# Patient Record
Sex: Female | Born: 1970 | Race: White | Hispanic: No | Marital: Married | Smoking: Never smoker
Health system: Southern US, Community
[De-identification: ages and names within clinical notes are randomized; demographics above are authoritative.]

## PROBLEM LIST (undated history)

## (undated) DIAGNOSIS — I10 Essential (primary) hypertension: Secondary | ICD-10-CM

## (undated) DIAGNOSIS — E079 Disorder of thyroid, unspecified: Secondary | ICD-10-CM

## (undated) HISTORY — PX: MENISCECTOMY: SHX123

## (undated) HISTORY — DX: Essential (primary) hypertension: I10

## (undated) HISTORY — PX: THYROIDECTOMY: SHX17

## (undated) HISTORY — PX: ABLATION: SHX5711

---

## 1999-03-14 ENCOUNTER — Other Ambulatory Visit: Admission: RE | Admit: 1999-03-14 | Discharge: 1999-03-14 | Payer: Self-pay | Admitting: Obstetrics and Gynecology

## 2000-04-02 ENCOUNTER — Other Ambulatory Visit: Admission: RE | Admit: 2000-04-02 | Discharge: 2000-04-02 | Payer: Self-pay | Admitting: Obstetrics and Gynecology

## 2001-05-19 ENCOUNTER — Other Ambulatory Visit: Admission: RE | Admit: 2001-05-19 | Discharge: 2001-05-19 | Payer: Self-pay | Admitting: Obstetrics and Gynecology

## 2001-06-25 ENCOUNTER — Encounter: Payer: Self-pay | Admitting: Obstetrics and Gynecology

## 2001-06-25 ENCOUNTER — Ambulatory Visit (HOSPITAL_COMMUNITY): Admission: RE | Admit: 2001-06-25 | Discharge: 2001-06-25 | Payer: Self-pay | Admitting: Obstetrics and Gynecology

## 2001-07-18 ENCOUNTER — Observation Stay (HOSPITAL_COMMUNITY): Admission: AD | Admit: 2001-07-18 | Discharge: 2001-07-19 | Payer: Self-pay | Admitting: Obstetrics and Gynecology

## 2001-08-16 ENCOUNTER — Encounter: Payer: Self-pay | Admitting: Obstetrics and Gynecology

## 2001-08-16 ENCOUNTER — Ambulatory Visit (HOSPITAL_COMMUNITY): Admission: RE | Admit: 2001-08-16 | Discharge: 2001-08-16 | Payer: Self-pay | Admitting: Obstetrics and Gynecology

## 2001-09-09 ENCOUNTER — Ambulatory Visit (HOSPITAL_COMMUNITY): Admission: RE | Admit: 2001-09-09 | Discharge: 2001-09-09 | Payer: Self-pay | Admitting: Obstetrics and Gynecology

## 2001-11-25 ENCOUNTER — Inpatient Hospital Stay (HOSPITAL_COMMUNITY): Admission: AD | Admit: 2001-11-25 | Discharge: 2001-11-28 | Payer: Self-pay | Admitting: Obstetrics and Gynecology

## 2003-05-23 ENCOUNTER — Other Ambulatory Visit: Admission: RE | Admit: 2003-05-23 | Discharge: 2003-05-23 | Payer: Self-pay

## 2004-04-23 ENCOUNTER — Other Ambulatory Visit: Admission: RE | Admit: 2004-04-23 | Discharge: 2004-04-23 | Payer: Self-pay | Admitting: Diagnostic Radiology

## 2004-05-17 ENCOUNTER — Observation Stay (HOSPITAL_COMMUNITY): Admission: RE | Admit: 2004-05-17 | Discharge: 2004-05-18 | Payer: Self-pay

## 2004-05-17 ENCOUNTER — Encounter (INDEPENDENT_AMBULATORY_CARE_PROVIDER_SITE_OTHER): Payer: Self-pay | Admitting: Specialist

## 2004-05-29 ENCOUNTER — Other Ambulatory Visit: Admission: RE | Admit: 2004-05-29 | Discharge: 2004-05-29 | Payer: Self-pay | Admitting: Obstetrics and Gynecology

## 2004-07-12 ENCOUNTER — Observation Stay (HOSPITAL_COMMUNITY): Admission: RE | Admit: 2004-07-12 | Discharge: 2004-07-13 | Payer: Self-pay

## 2004-07-12 ENCOUNTER — Encounter (INDEPENDENT_AMBULATORY_CARE_PROVIDER_SITE_OTHER): Payer: Self-pay | Admitting: Specialist

## 2004-08-13 ENCOUNTER — Encounter (HOSPITAL_COMMUNITY): Admission: RE | Admit: 2004-08-13 | Discharge: 2004-11-11 | Payer: Self-pay | Admitting: Endocrinology

## 2005-04-07 ENCOUNTER — Encounter (HOSPITAL_COMMUNITY): Admission: RE | Admit: 2005-04-07 | Discharge: 2005-07-06 | Payer: Self-pay | Admitting: Endocrinology

## 2005-06-03 ENCOUNTER — Other Ambulatory Visit: Admission: RE | Admit: 2005-06-03 | Discharge: 2005-06-03 | Payer: Self-pay | Admitting: Obstetrics and Gynecology

## 2005-12-29 ENCOUNTER — Inpatient Hospital Stay (HOSPITAL_COMMUNITY): Admission: AD | Admit: 2005-12-29 | Discharge: 2005-12-29 | Payer: Self-pay | Admitting: Obstetrics and Gynecology

## 2006-03-11 ENCOUNTER — Inpatient Hospital Stay (HOSPITAL_COMMUNITY): Admission: AD | Admit: 2006-03-11 | Discharge: 2006-03-15 | Payer: Self-pay | Admitting: Obstetrics and Gynecology

## 2006-03-12 ENCOUNTER — Encounter (INDEPENDENT_AMBULATORY_CARE_PROVIDER_SITE_OTHER): Payer: Self-pay | Admitting: *Deleted

## 2007-05-10 ENCOUNTER — Encounter (HOSPITAL_COMMUNITY): Admission: RE | Admit: 2007-05-10 | Discharge: 2007-05-14 | Payer: Self-pay | Admitting: Internal Medicine

## 2010-12-29 ENCOUNTER — Encounter: Payer: Self-pay | Admitting: Endocrinology

## 2011-04-25 NOTE — Op Note (Signed)
NAME:  Paula Ortega, Paula Ortega NO.:  [id number]   MEDICAL RECORD NO.:  [id number]                   PATIENT TYPE:  OBV   LOCATION:  1610                                 FACILITY:  [facility]   PHYSICIAN:  Lorre Munroe., M.D.            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   OPERATION:  Thyroid isthmectomy.   SURGEON:  Lebron Conners, M.D.   ASSISTANT:  Abigail Miyamoto, M.D.   ANESTHESIA:  General.   DESCRIPTION OF PROCEDURE:  After the patient was monitored and anesthetized  and had routine preparation and draping of the anterior and lateral neck, I  made a collar incision about 5 cm in length across the midline about 2 cm  above the sternal notch. I dissected down through the fat and platysma and  then raised skin and platysmal flaps cephalad to the thyroid cartilage and  caudad to the sternal notch.  With those flaps retracted up and down, I  incised the midline of the neck and came directly down on the thyroid  isthmus and identified the abnormality.  It lay to the left of the midline  but was quite definitely within the isthmus providing an opportunity to get  a good amount of tissue on either side of it without doing a lobectomy.  Since it was toward the left, I first dissected the isthmus free of the  trachea to the right side and clamped across it with Kelly clamps and then  freed the isthmus up off the trachea.  I suture ligated the lobe cut  surface.  I then dissected up the left side and similarly clamped and cut  the isthmus out and sent it for frozen section.  I then suture ligated the  cut surface on the left side using 4-0 Vicryl.  There were no other nodules  palpable.  I could not  feel any cervical lymphadenopathy.  Dr. 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  I therefore decided not to  proceed with bilateral lobectomy.  After assuring good hemostasis and  irrigating the wound and obtaining correct sponge, needle and instrument  counts, I closed the midline with running 3-0 Vicryl and platysma with  interrupted 3-0 Vicryl and the skin with a running intracuticular 4-0 Vicryl  reinforced by Steri-Strips. The patient was stable upon transfer to PACU.  Lorre Munroe., M.D.   WB/MEDQ  D:  05/17/2004  T:  05/17/2004  Job:  [id number]   cc:   Joycelyn Rua, M.D.  [address] [place]  [place] [address]  Fax: [phone]

## 2011-04-25 NOTE — Discharge Summary (Signed)
[facility]  Patient:    Paula Ortega, Paula Ortega Visit Number: [id number] MRN: [id number]          Service Type: OBS Location: [id number] Attending Physician:  Michaele Offer Dictated by:   Alvino Chapel, M.D. Admit Date:  11/25/2001 Discharge Date: 11/28/2001                             Discharge Summary  DISCHARGE DIAGNOSES:          1. Term pregnancy at 40 weeks, delivered.                               2. Meconium-stained amniotic fluid.                               3. Normal spontaneous vaginal delivery.  DISCHARGE MEDICATIONS:        1. Motrin 600 mg p.o. every six hours.                               2. Percocet one to two tablets p.o. every four                                  hours.  DISCHARGE FOLLOWUP:           The patient is to follow up in six weeks for a routine postpartum exam.  HOSPITAL COURSE:              The patient is a 40 year old, G1, P0, who is admitted at 40 weeks for induction given a favorable cervix. The patient had no complications this pregnancy.  PRENATAL LABORATORY DATA:     Blood type is AB negative, antibody negative, RPR nonreactive, rubella immune, hepatitis B surface antigen negative, GC negative, Chlamydia negative, one hour glucola was elevated at 160 and three hour glucola was normal. Group B strep was negative.  PAST MEDICAL HISTORY:         None.  PAST SURGICAL HISTORY:        Left knee surgery.  PAST OBSTETRIC HISTORY:       None.  ALLERGIES:                    No known drug allergies.  FAMILY HISTORY:               The patient is adopted so her family history is unknown.  PHYSICAL EXAMINATION:         The patient is afebrile with stable vital signs. Fetal heart rate is reactive. She is gravid and nontender. EFW is 8 pounds. On exam the patient was 3 cm, 50% effaced, and -1 station. She has assisted rupture of membranes with light meconium noted. She was begun on Pitocin  and continued to progress.  After laboring all day the patient had reached complete dilation and pushed well. She had a normal spontaneous vaginal delivery of a 9-pound 5-ounce infant boy over a second degree laceration. Apgars were 7 and 9. There was a loose nuchal cord reduced. The anterior shoulder was slightly slow to deliver but there is no true dystocia. Light meconium fluid was bulb suctioned. Placenta delivered spontaneous  and intact. Second degree laceration and bilateral labial lacerations were noted. The second degree laceration was repaired with 3-0 Vicryl as well as the right labial laceration. The patient was then admitted for routine postpartum care and did very well. On postpartum day #1 she felt well and was felt stable for discharge. She requested this and was to be discharged the evening of postpartum day #1. She will follow up as previously stated.  Dictated by:   Alvino Chapel, M.D. Attending Physician:  Michaele Offer DD:  11/27/01 TD:  11/28/01 Job: [id number]

## 2011-04-25 NOTE — Discharge Summary (Signed)
NAMEMarland Ortega  [name], [name]                 ACCOUNT NO.:  [id number]   MEDICAL RECORD NO.:  [id number]          PATIENT TYPE:  INP   LOCATION:  9102                          FACILITY:  [facility]   PHYSICIAN:  Zenaida Niece, M.D.DATE OF BIRTH:  1971-03-12   DATE OF ADMISSION:  03/11/2006  DATE OF DISCHARGE:  03/15/2006                                 DISCHARGE SUMMARY   ADMISSION DIAGNOSIS:  Intrauterine pregnancy at 38 weeks.   DISCHARGE DIAGNOSES:  1.  Intrauterine pregnancy at 38 weeks.  2.  Arrest of dilation.  3.  Fetal macrosomia.  4.  Desires Surgical Sterility   PROCEDURES:  On March 12, 2006, she had a primary low transverse cesarean  section and tubal ligation.   HISTORY AND PHYSICAL:  This is a 40 year old white female gravida 2, para 1-  0-0-1 with an EGA of 38+ weeks who presents for elective induction.  She was  initially scheduled for a primary cesarean section on April05, 2007  due to  being breech.  However on her preop evaluation the fetus is in vertex  presentation and an ultrasound on April02, 2007 confirms vertex presentation  with an estimated fetal weight of approximately 4000 g and AFI of 20.  Prenatal care complicated by a history of hypothyroidism secondary to a  thyroidectomy for thyroid cancer, sinusitis at 16 weeks treated with  Augmentin, and her course was otherwise uncomplicated.   PAST OB HISTORY:  In 2002 vaginal delivery at 40 weeks, 9 pounds 5 ounces,  complicated by meconium.   PAST MEDICAL HISTORY:  Thyroid cancer.   PAST SURGICAL HISTORY:  Thyroidectomy and left knee surgery.   MEDICATIONS:  Synthroid 150 mcg daily.   PHYSICAL EXAM:  She is afebrile with stable vital signs.  Fetal heart  tracing is reactive.  Abdomen is gravid and nontender with an estimated  fetal weight of approximately 9 pounds.  Cervix on my first exam in the  hospital is 2-3, 30, -3, vertex presentation, adequate pelvis and amniotomy  revealed light meconium-stained  amniotic fluid.   HOSPITAL COURSE:  The patient was admitted and started on Pitocin.  On my  first exam that evening I was able to perform amniotomy also for  augmentation.  She progressed into labor and received an epidural.  However,  she progressed to 6 cm and made no progress past that.  Thus on April05,  2007 she had a primary low transverse cesarean section and tubal ligation  under epidural anesthesia.  Estimated blood loss was 1000 mL.  The patient  had normal anatomy and delivered a viable female infant with Apgars of 9 and 9  that weighed 9 pounds 12 ounces.  Postoperatively she had no significant  complications.  Predelivery hemoglobin 11.6, postdelivery 8.6.  On the  morning of postop day #3 she was felt to be stable enough for discharge  home.  Her incision was healing well and prior to discharge her staples were  to be removed and Steri-Strips applied.   DISCHARGE INSTRUCTIONS:  Regular diet, pelvic rest and no strenuous  activity.  Followup is  in approximately 2 weeks for an incision check.  Medications are Percocet #40 one to two p.o. q.4-6 h p.r.n. pain and over-  the-counter ibuprofen as needed.  She is also given our discharge pamphlet.      Zenaida Niece, M.D.  Electronically Signed     TDM/MEDQ  D:  03/15/2006  T:  03/16/2006  Job:  [id number]

## 2011-04-25 NOTE — Discharge Summary (Signed)
[facility]  Patient:    Paula Ortega, Paula Ortega Visit Number: [id number] MRN: [id number]          Service Type: OBS Location: [phone] Attending Physician:  Michaele Offer Dictated by:   Alvino Chapel, M.D. Admit Date:  11/25/2001 Discharge Date: 11/28/2001                             Discharge Summary  ADDENDUM  HISTORY:                      Briefly, the patient is a 40 year old, G1, P0, who had a normal spontaneous vaginal delivery on November 26, 2001. She had been a candidate for early discharge, however, decided to stay an extra day to work on breast-feeding and therefore was in house until November 28, 2001.  Upon discharge she was afebrile with stable vital signs and breast-feeding had begun to improve somewhat. She was discharged to home with instructions to follow up with the lactation consultant as needed. Dictated by:   Alvino Chapel, M.D. Attending Physician:  Michaele Offer DD:  11/28/01 TD:  11/28/01 Job: [id number]

## 2011-04-25 NOTE — Op Note (Signed)
NAME:  Paula Ortega, Paula Ortega NO.:  [id number]   MEDICAL RECORD NO.:  [id number]          PATIENT TYPE:  INP   LOCATION:  9102                          FACILITY:  [facility]   PHYSICIAN:  Leighton Roach Meisinger, M.D.DATE OF BIRTH:  1970/12/11   DATE OF PROCEDURE:  03/12/2006  DATE OF DISCHARGE:                                 OPERATIVE REPORT   PREOPERATIVE DIAGNOSIS:  Intrauterine pregnancy at 38+ weeks, arrest of  dilation and desires surgical sterility.   POSTOPERATIVE DIAGNOSIS:  Intrauterine pregnancy at 38+ weeks, arrest of  dilation and desires surgical sterility, and fetal macrosomia.   PROCEDURES:  Primary low transverse cesarean section and bilateral partial  salpingectomy.   SURGEON:  Zenaida Niece, M.D.   ANESTHESIA:  Epidural.   ESTIMATED BLOOD LOSS:  1000 mL.   SPECIMENS:  Portions of bilateral fallopian tubes.   FINDINGS:  The patient had normal anatomy, delivered a viable female infant  with Apgars of 9 and 9, weight 9 pounds 12 ounces.   PROCEDURE IN DETAIL:  The patient was taken to the operating room and placed  in the dorsosupine position.  Her previously placed epidural was dosed  appropriately.  Abdomen was then prepped and draped in the usual sterile  fashion.  The level of her anesthesia was found to be adequate and abdomen  was entered via standard Pfannenstiel's incision.  The self-retaining  retractor was then placed and a 4 cm transverse incision was made in the  lower uterine segment pushing the bladder inferior.  Once the uterine cavity  was entered the incision was extended bilaterally digitally.  The fetal  vertex was grasped and delivered through the incision atraumatically.  Mouth  and nares were suctioned.  A loose nuchal cord times one was reduced.  The  remainder of the infant then delivered atraumatically.  Cord was doubly  clamped and cut and the infant handed to the awaiting pediatric team.  Cord  blood was obtained and the  placenta was delivered spontaneously and sent for  cord blood collection.  Uterus was wiped dry with a clean lap pad and all  clots and debris removed.  Uterus was exteriorized and the incision was  inspected and found to be free of extensions.  The uterine incision was  closed in one layer being running locking layer with #1 chromic.  Bleeding  from the right angle was controlled with #1 chromic and 3-0 Vicryl.   Attention was turned to the tubal ligation.  Both fallopian tubes were  identified and traced to their fimbriated ends.  A knuckle of the middle  portion of each tube was grasped with a Babcock clamp.  A window was made in  an avascular portion of the mesosalpinx with electrocautery.  Zero plain gut  suture was passed through and tied proximally and then wrapped around  distally to form a knuckle of tube.  This knuckle of tube was then removed  sharply.  On both sides both tubal ostia were identified and the stumps were  hemostatic.  The uterus was then returned the abdomen.  3-0 Vicryl was used  for hemostasis on the left side and electrocautery was used on the  peritoneal edges.  The subfascial space was then irrigated and made  hemostatic with electrocautery.  Fascia was closed in a running fashion  starting at both ends and meeting in the middle with 0 Vicryl.  Subcutaneous  tissue was  then irrigated and made hemostatic with electrocautery.  The skin was closed  with staples followed by a sterile dressing.  The patient tolerated the  procedure well.  She was taken to recovery in stable condition after  tolerating the procedure well.  Counts were correct x2.  She received Ancef  1 gram after cord clamp.      Zenaida Niece, M.D.  Electronically Signed     TDM/MEDQ  D:  03/12/2006  T:  03/13/2006  Job:  [id number]

## 2011-04-25 NOTE — Op Note (Signed)
NAME:  Paula Ortega, PITA NO.:  [id number]   MEDICAL RECORD NO.:  [id number]                   PATIENT TYPE:  OBV   LOCATION:  0444                                 [facility]ACILITY:  [facility]   PHYSICIAN:  Lorre Munroe., M.D.            DATE OF BIRTH:  November 21, 1971   DATE OF PROCEDURE:  07/12/2004  DATE OF DISCHARGE:                                 OPERATIVE REPORT   PREOPERATIVE DIAGNOSES:  Papillary carcinoma of the thyroid gland.   POSTOPERATIVE DIAGNOSES:  Papillary carcinoma of the thyroid gland.   OPERATION:  Completion thyroidectomy (bilateral thyroid lobectomy).   SURGEON:  Lebron Conners, M.D.   ASSISTANT:  Ollen Gross. Carolynne Edouard, M.D.   ANESTHESIA:  General.   CLINICAL COURSE:  The patient is a 40 year old white female who has had a  thyroid isthmectomy because of a nonfunctional nodule which on histologic  examination was found to be a well differentiated carcinoma. After thorough  consideration and discussion, the patient elected to have completion  thyroidectomy particularly because of her plans for further pregnancies and  strong desire not to have a situation of recurrent thyroid cancer with  pregnancy occurring. She accept the risk of hypoparathyroidism and recurrent  laryngeal nerve injury.   DESCRIPTION OF PROCEDURE:  After the patient was monitored and anesthetized  and had routine preparation and draping of the neck, I reopened the previous  low neck transverse incision and extended it just slightly to the right  side.  I dissected down through the scar tissue and platysma muscle and  identified the sternocleidomastoid bilaterally.  I raised skin, subcutaneous  and muscular flaps under the platysma cephalad to the thyroid cartilage and  caudad to the sternal notch.  I put in a [place] self retaining retractor  and then incised the scar tissue in the midline right over the thyroid and  dissected laterally under the strap muscles. I worked first  on the left side  and dissected laterally and found the middle thyroid vein and clipped and  divided it. I then dissected up the edge of the gland and divided a little  bit of the anterior tissue and identified the superior pole vessels. The  left lobe was small. I clipped and divided the superior pole vessels and  then rolled the gland further forward and I identified what seemed likely to  be a parathyroid gland which corresponded to the superior gland and  preserved that. I then dissected the lower pole vessels and clipped and  divided the branches as they entered the thyroid gland.  I then divided the  anterior scar tissue and rolled the lobe forward onto the trachea,  dissecting very close to the gland.  I noted the recurrent nerve and made  sure I did not injure it.  I then completely removed the left lobe of the  thyroid gland and packed the area with dry gauze.  I  then followed a similar  procedure on the right side. The right lobe was considerably larger than the  left with more well developed vessels. I ligated and clipped the superior  pole vessels and performed a similar dissection. I did not see the recurrent  laryngeal nerve on the left side but kept my dissection extremely close to  the thyroid gland as I dissected it up out of the tracheoesophageal groove  and off the surface of the trachea. I completely excised the lobe and packed  it. I then obtained hemostasis on both sides with a combination of clips and  cautery and put small  pieces of Surgicel in the dissected area bilaterally.  Sponge, needle and  instrument counts were correct.  I closed the midline with running 3-0  Vicryl and closed the platysma and scar tissue with running 3-0 Vicryl and  closed the skin with running intracuticular 4-0 Vicryl and Steri-Strips.  She tolerated the operation well.                                               Lorre Munroe., M.D.    Jodi Marble  D:  07/12/2004  T:  07/13/2004   Job:  [id number]   cc:   Joycelyn Rua, M.D.  [address] [place]  [place] [address]  Fax: [phone]

## 2017-05-04 ENCOUNTER — Ambulatory Visit (INDEPENDENT_AMBULATORY_CARE_PROVIDER_SITE_OTHER): Payer: BC Managed Care – PPO

## 2017-05-04 ENCOUNTER — Encounter (HOSPITAL_COMMUNITY): Payer: Self-pay | Admitting: *Deleted

## 2017-05-04 ENCOUNTER — Ambulatory Visit (HOSPITAL_COMMUNITY)
Admission: EM | Admit: 2017-05-04 | Discharge: 2017-05-04 | Disposition: A | Discharge status: Home / Self Care | Payer: BC Managed Care – PPO | Attending: Family Medicine | Admitting: Family Medicine

## 2017-05-04 DIAGNOSIS — S29012A Strain of muscle and tendon of back wall of thorax, initial encounter: Secondary | ICD-10-CM

## 2017-05-04 DIAGNOSIS — M62830 Muscle spasm of back: Secondary | ICD-10-CM | POA: Diagnosis not present

## 2017-05-04 HISTORY — DX: Disorder of thyroid, unspecified: E07.9

## 2017-05-04 MED ORDER — CYCLOBENZAPRINE HCL 10 MG PO TABS: ORAL_TABLET | ORAL | 0 refills | Status: DC

## 2017-05-04 MED ORDER — KETOROLAC TROMETHAMINE 60 MG/2ML IM SOLN
INTRAMUSCULAR | Status: AC
  Filled 2017-05-04: qty 2

## 2017-05-04 MED ORDER — KETOROLAC TROMETHAMINE 60 MG/2ML IM SOLN
60.0000 mg | Freq: Once | INTRAMUSCULAR | Status: AC
  Administered 2017-05-04: 60 mg via INTRAMUSCULAR

## 2017-05-04 NOTE — ED Triage Notes (Signed)
Started with pain across mid-back 2 days ago - R<L; C/O SOB due to significant pain if she takes any deep breaths.  Denies any injuries.  Back non-tender to palpation.  Also has had some intermittent right supraclavicular pain with breathing, that was non-tender to palp PTA (has since resolved).  Turning torso aggravated pain.

## 2017-05-04 NOTE — Discharge Instructions (Signed)
You were given a shot of Toradol today to help with pain. Start Flexeril 10mg  1/2 to 1 tablet up to 3 times a day as needed for muscle spasms. May apply warm compresses to area for comfort. Recommend follow-up with your primary care provider in 3 to 4 days if not resolving or go to ER if pain worsens.

## 2017-05-04 NOTE — ED Provider Notes (Signed)
CSN: [id number]     Arrival date & time 05/04/17  1557 History   First MD Initiated Contact with Patient 05/04/17 1709     Chief Complaint  Patient presents with  . Back Pain  . Shortness of Breath   (Consider location/radiation/quality/duration/timing/severity/associated sxs/prior Treatment) 46 year old female presents with right sided back pain that is worse with deep breaths. Started 3 days ago in mid thoracic region bilaterally- no distinct injury but occurred after sitting and watching son's ball game. Now having more pain in the past few hours- mostly on right side. Pain is worse with taking deep breaths and twisting torso. No pain to palpation. Denies any fever, sweating, URI symptoms, cough, chest pain or GI symptoms. Has never had similar symptoms before. Has tried ice, heat and Mobic with no relief. No other chronic health issues except thyroid disease. Currently on Synthroid.    The history is provided by the patient.    Past Medical History:  Diagnosis Date  . Thyroid disease    Past Surgical History:  Procedure Laterality Date  . ABLATION    . CESAREAN SECTION    . MENISCECTOMY    . THYROIDECTOMY     No family history on file. Social History  Substance Use Topics  . Smoking status: Never Smoker  . Smokeless tobacco: Not on file  . Alcohol use Yes     Comment: occasionally   OB History    No data available     Review of Systems  Constitutional: Negative for appetite change, chills, diaphoresis, fatigue and fever.  HENT: Negative for congestion, ear pain, postnasal drip, rhinorrhea, sinus pain, sinus pressure, sore throat and trouble swallowing.   Respiratory: Positive for shortness of breath. Negative for cough, chest tightness and wheezing.   Cardiovascular: Negative for chest pain and palpitations.  Gastrointestinal: Negative for abdominal pain, nausea and vomiting.  Genitourinary: Negative for decreased urine volume, difficulty urinating and dysuria.    Musculoskeletal: Positive for back pain and myalgias (muscle spasms). Negative for arthralgias, neck pain and neck stiffness.  Skin: Negative for rash and wound.  Neurological: Negative for dizziness, syncope, weakness, light-headedness and headaches.  Hematological: Negative for adenopathy.    Allergies  Patient has no known allergies.  Home Medications   Prior to Admission medications   Medication Sig Start Date End Date Taking? Authorizing Provider  levothyroxine (SYNTHROID, LEVOTHROID) 150 MCG tablet Take 150 mcg by mouth daily before breakfast. Takes 6 days/wk; on 7th day only takes 1.5 pills   Yes [provider]  MELOXICAM PO Take by mouth.   Yes [provider]  cyclobenzaprine (FLEXERIL) 10 MG tablet Take 1/2 to 1 tablet by mouth up to 3 times a day as needed for muscle spasms 05/04/17   Sudie GrumblingAmyot, Charmion Hapke Berry, NP   Meds Ordered and Administered this Visit   Medications  ketorolac (TORADOL) injection 60 mg (60 mg Intramuscular Given 05/04/17 1815)    BP 132/77 (BP Location: Right Arm)   Pulse 84   Temp 99.3 F (37.4 C) (Oral)   Resp (!) 24   SpO2 100%  No data found.   Physical Exam  Constitutional: She is oriented to person, place, and time. She appears well-developed and well-nourished. She appears distressed.  Appears uncomfortable sitting on exam table due to pain.   HENT:  Head: Normocephalic and atraumatic.  Nose: Nose normal.  Mouth/Throat: Oropharynx is clear and moist.  Eyes: Conjunctivae and EOM are normal.  Neck: Normal range of motion  and full passive range of motion without pain. Neck supple. No spinous process tenderness and no muscular tenderness present. No neck rigidity. Normal range of motion present.  Cardiovascular: Normal rate, regular rhythm and normal heart sounds.   No murmur heard. Pulmonary/Chest: Effort normal and breath sounds normal. No accessory muscle usage. Tachypnea noted. No respiratory distress. She has no decreased  breath sounds. She has no wheezes. She has no rhonchi. She has no rales.  Musculoskeletal: She exhibits no tenderness.       Thoracic back: She exhibits decreased range of motion, pain and spasm. She exhibits no tenderness, no swelling, no edema and no deformity.       Back:  No tenderness to palpation but decreased range of motion- especially with rotation. Raising arms increased pain. Muscle spasms present on right mid-thoracic area. No numbness or neuro deficits noted.   Lymphadenopathy:    She has no cervical adenopathy.  Neurological: She is alert and oriented to person, place, and time. She has normal strength. No sensory deficit.  Skin: Skin is warm and dry. Capillary refill takes less than 2 seconds. No rash noted.  Psychiatric: She has a normal mood and affect. Her behavior is normal. Judgment and thought content normal.    Urgent Care Course     Procedures (including critical care time)  Labs Review Labs Reviewed - No data to display  Imaging Review Dg Chest 2 View  Result Date: 05/04/2017 CLINICAL DATA:  Pain in right mid back. EXAM: CHEST  2 VIEW COMPARISON:  None. FINDINGS: The heart size and mediastinal contours are within normal limits. Both lungs are clear. The visualized skeletal structures are unremarkable. IMPRESSION: No active cardiopulmonary disease. Electronically Signed   By: Gerome Sam III M.D   On: 05/04/2017 17:44     Visual Acuity Review  Right Eye Distance:   Left Eye Distance:   Bilateral Distance:    Right Eye Near:   Left Eye Near:    Bilateral Near:         MDM   1. Upper back strain, initial encounter   2. Spasm of thoracic back muscle    Reviewed negative chest x-ray results with patient. Discussed that history and clinical findings suggest muscle strain and spasm. Gave Toradol 60mg  IM now to help with pain. May continue Mobic 15mg  daily as needed. Start Flexeril 10mg  1/2 to 1 tablet up to 3 times a day as needed for muscle spasms.  May apply warm compresses to area for comfort. Recommend follow-up with her primary care provider in 3 to 4 days if not improving or go to ER if symptoms worsen.     Sudie Grumbling, NP 05/04/17 [id number]

## 2017-10-10 ENCOUNTER — Encounter (HOSPITAL_COMMUNITY): Payer: Self-pay | Admitting: Emergency Medicine

## 2017-10-10 ENCOUNTER — Ambulatory Visit (HOSPITAL_COMMUNITY)
Admission: EM | Admit: 2017-10-10 | Discharge: 2017-10-10 | Disposition: A | Discharge status: Home / Self Care | Payer: BC Managed Care – PPO | Attending: Family Medicine | Admitting: Family Medicine

## 2017-10-10 DIAGNOSIS — R21 Rash and other nonspecific skin eruption: Secondary | ICD-10-CM

## 2017-10-10 NOTE — ED Triage Notes (Signed)
Pt reports rash, and poss shingles, on lower right side back  Reports it started out w/a "phantom itch" 2 weeks but the rash itself appeared about 1-1.5 weeks ago associated w/HA  Denies fevers, chills  Pt reports she was applying husbands eczema cream w/no relief.   A&O x4... NAD... Ambulatory

## 2017-10-10 NOTE — ED Provider Notes (Signed)
[facility]    CSN: [id number] Arrival date & time: 10/10/17  1200     History   Chief Complaint Chief Complaint  Patient presents with  . Rash    HPI Paula Ortega is a 46 y.o. female.   Paula Ortega presents with complaints of rash to her right mid back. She states she felt a "phantom itch" to the area for approximately 1 week before approximately 5 bumps developed 1 week ago. More have not developed since then. It is mildly painful, but primarily itchy. Rates pain 1/10. Very itchy. Hydrocortisone cream helps with itching. No drainage that she has notice. The bumps were red and now have darkened in color. No known allergen exposures or new products/pets. Has had chickenpox as a child. Rash is not worsening. She has had mild headache. Better today. Denies uri symptoms.    ROS per HPI.       Past Medical History:  Diagnosis Date  . Thyroid disease     There are no active problems to display for this patient.   Past Surgical History:  Procedure Laterality Date  . ABLATION    . CESAREAN SECTION    . MENISCECTOMY    . THYROIDECTOMY      OB History    No data available       Home Medications    Prior to Admission medications   Medication Sig Start Date End Date Taking? Authorizing Provider  levothyroxine (SYNTHROID, LEVOTHROID) 150 MCG tablet Take 150 mcg by mouth daily before breakfast. Takes 6 days/wk; on 7th day only takes 1.5 pills   Yes [provider]  cyclobenzaprine (FLEXERIL) 10 MG tablet Take 1/2 to 1 tablet by mouth up to 3 times a day as needed for muscle spasms 05/04/17   Sudie Grumbling, NP  MELOXICAM PO Take by mouth.    [provider]    Family History History reviewed. No pertinent family history.  Social History Social History  Substance Use Topics  . Smoking status: Never Smoker  . Smokeless tobacco: Never Used  . Alcohol use Yes     Comment: occasionally     Allergies   Patient has no known  allergies.   Review of Systems Review of Systems   Physical Exam Triage Vital Signs ED Triage Vitals  Enc Vitals Group     BP 10/10/17 1214 (!) 136/102     Pulse Rate 10/10/17 1214 84     Resp 10/10/17 1214 20     Temp 10/10/17 1214 98.5 F (36.9 C)     Temp Source 10/10/17 1214 Oral     SpO2 10/10/17 1214 99 %     Weight --      Height --      Head Circumference --      Peak Flow --      Pain Score 10/10/17 1211 1     Pain Loc --      Pain Edu? --      Excl. in GC? --    No data found.   Updated Vital Signs BP (!) 136/102 (BP Location: Left Arm)   Pulse 84   Temp 98.5 F (36.9 C) (Oral)   Resp 20   SpO2 99%   Visual Acuity Right Eye Distance:   Left Eye Distance:   Bilateral Distance:    Right Eye Near:   Left Eye Near:    Bilateral Near:     Physical Exam  Constitutional: She is oriented  to person, place, and time. She appears well-developed and well-nourished. No distress.  Cardiovascular: Normal rate, regular rhythm and normal heart sounds.   Pulmonary/Chest: Effort normal and breath sounds normal.  Neurological: She is alert and oriented to person, place, and time.  Skin: Skin is warm and dry. Rash noted. Rash is papular.     ~ 5 healing papules, please see photo. None tender without drainage  Vitals reviewed.      UC Treatments / Results  Labs (all labs ordered are listed, but only abnormal results are displayed) Labs Reviewed - No data to display  EKG  EKG Interpretation None       Radiology No results found.  Procedures Procedures (including critical care time)  Medications Ordered in UC Medications - No data to display   Initial Impression / Assessment and Plan / UC Course  I have reviewed the triage vital signs and the nursing notes.  Pertinent labs & imaging results that were available during my care of the patient were reviewed by me and considered in my medical decision making (see chart for details).     Healing  lesions concerning for possible shingles. 1 dermatome and do not pass midline. Course of rash is consistent with shingles. Continue with supportive cares for comfort as rash has been present for 1 week. Return to be seen if develop new lesions, signs of infection or otherwise worsening of symptoms. Patient verbalized understanding and agreeable to plan.      Final Clinical Impressions(s) / UC Diagnoses   Final diagnoses:  Rash    New Prescriptions New Prescriptions   No medications on file     Controlled Substance Prescriptions [place] Controlled Substance Registry consulted? Not Applicable   [name], Natalie B, NP 10/10/17 1238

## 2019-08-22 ENCOUNTER — Ambulatory Visit
Admission: EM | Admit: 2019-08-22 | Discharge: 2019-08-22 | Disposition: A | Discharge status: Home / Self Care | Payer: BC Managed Care – PPO | Attending: Emergency Medicine | Admitting: Emergency Medicine

## 2019-08-22 ENCOUNTER — Other Ambulatory Visit: Payer: Self-pay

## 2019-08-22 ENCOUNTER — Ambulatory Visit (INDEPENDENT_AMBULATORY_CARE_PROVIDER_SITE_OTHER): Payer: BC Managed Care – PPO

## 2019-08-22 DIAGNOSIS — M25521 Pain in right elbow: Secondary | ICD-10-CM | POA: Diagnosis not present

## 2019-08-22 DIAGNOSIS — S59901A Unspecified injury of right elbow, initial encounter: Secondary | ICD-10-CM

## 2019-08-22 MED ORDER — NAPROXEN 500 MG PO TABS: 500.0000 mg | ORAL_TABLET | Freq: Two times a day (BID) | ORAL | 0 refills | Status: DC

## 2019-08-22 NOTE — Discharge Instructions (Signed)
X-rays did not show fracture or dislocation Splint placed Continue conservative management of rest, ice, and elevation Take naproxen as needed for pain relief (may cause abdominal discomfort, ulcers, and GI bleeds avoid taking with other NSAIDs) Follow up with PCP or orthopedist if symptoms persist Return or go to the ER if you have any new or worsening symptoms (fever, chills, increased swelling, redness, pain, symptoms do not improve with medications, etc...)
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
## 2020-12-05 NOTE — Progress Notes (Signed)
Primary Physician/Referring:  Joycelyn Rua, MD  Patient ID: Paula Ortega, female    DOB: 17-Mar-1971, 49 y.o.   MRN: [id number]  Chief Complaint  Patient presents with  . Pre-op Exam    Left total knee   . Abnormal ECG  . New Patient (Initial Visit)   HPI:    Paula Ortega  is a 49 y.o. caucasian female with history of thyroid cancer and thyroidectomy in 2005. Denies history of hypertension, hyperlipidemia, coronary artery disease, diabetes, TIA/CVA. Patient was adopted and only knows her mother's medical history, denies known family history of premature CAD or heart failure. She has never smoked and occasionally drinks alcohol.   Patient presents for preoperative risk stratification to undergo left total knee replacement under general anesthesia. Patient states that due to left knee pain she has been inactive for the last 1 year, but previously rode her bike 2-3 times per week and occasional ran or walked without issue. However, over the last year she has experienced decreased exercise tolerance. She also reports she has gained significant weight, particularly over the last 6 months. Denies chest pain, dyspnea, syncope, near-syncope, dizziness.    Past Medical History:  Diagnosis Date  . Thyroid disease    Past Surgical History:  Procedure Laterality Date  . ABLATION     uterine  . CESAREAN SECTION    . MENISCECTOMY    . THYROIDECTOMY     History reviewed. No pertinent family history.  Social History   Tobacco Use  . Smoking status: Never Smoker  . Smokeless tobacco: Never Used  Substance Use Topics  . Alcohol use: Yes    Comment: occasionally   Marital Status: Married   ROS  Review of Systems  Constitutional: Positive for weight gain. Negative for malaise/fatigue.  Cardiovascular: Negative for chest pain, claudication, leg swelling, near-syncope, orthopnea, palpitations, paroxysmal nocturnal dyspnea and syncope.  Respiratory: Negative for shortness of breath.    Hematologic/Lymphatic: Does not bruise/bleed easily.  Musculoskeletal: Positive for joint pain (left knee) and joint swelling (left knee).  Gastrointestinal: Negative for melena.  Neurological: Negative for dizziness and weakness.    Objective  Blood pressure 124/90, pulse 71, resp. rate 16, height 5\' 8"  (1.727 m), weight 215 lb (97.5 kg), SpO2 98 %.  Vitals with BMI 12/06/2020 12/06/2020 08/22/2019  Height - 5\' 8"  -  Weight - 215 lbs -  BMI - 32.7 -  Systolic 124 123 08/24/2019  Diastolic 90 90 89  Pulse 71 75 78   Body mass index is 32.69 kg/m.    Physical Exam Vitals reviewed.  Constitutional:      General: She is not in acute distress.    Appearance: Normal appearance. She is obese. She is not ill-appearing.  HENT:     Head: Normocephalic and atraumatic.  Cardiovascular:     Rate and Rhythm: Normal rate and regular rhythm.     Pulses: Intact distal pulses.          Carotid pulses are 2+ on the right side and 2+ on the left side.      Radial pulses are 2+ on the right side and 2+ on the left side.       Dorsalis pedis pulses are 2+ on the right side and 2+ on the left side.       Posterior tibial pulses are 2+ on the right side and 2+ on the left side.     Heart sounds: S1 normal and S2 normal. No murmur  heard. No gallop.      Comments: Popliteal and femoral pulse difficult to evaluate due to patient body habitus Pulmonary:     Effort: Pulmonary effort is normal. No respiratory distress.     Breath sounds: No wheezing, rhonchi or rales.  Abdominal:     General: Bowel sounds are normal.     Palpations: Abdomen is soft.     Tenderness: There is no abdominal tenderness.  Musculoskeletal:     Right lower leg: No edema.     Left lower leg: No edema.     Comments: Left knee brace in place  Skin:    General: Skin is warm and dry.  Neurological:     Mental Status: She is alert.     Laboratory examination:   No results for input(s): NA, K, CL, CO2, GLUCOSE, BUN,  CREATININE, CALCIUM, GFRNONAA, GFRAA in the last 8760 hours. CrCl cannot be calculated (No successful lab value found.).  No flowsheet data found. No flowsheet data found.  Lipid Panel No results for input(s): CHOL, TRIG, LDLCALC, VLDL, HDL, CHOLHDL, LDLDIRECT in the last 8760 hours.  HEMOGLOBIN A1C No results found for: HGBA1C, MPG TSH No results for input(s): TSH in the last 8760 hours.  External labs:   11/21/2020: Hemoglobin 12.9, hematocrit 39.5, MCV 85.8, platelet 222 Glucose 103, BUN 15, creatinine 0.83, GFR 74, sodium 140, potassium 3.9  Medications and allergies  No Known Allergies   Outpatient Medications Prior to Visit  Medication Sig Dispense Refill  . levothyroxine (SYNTHROID, LEVOTHROID) 150 MCG tablet Take 150 mcg by mouth daily before breakfast. Takes 6 days/wk; on 7th day only takes 1.5 pills    . MELOXICAM PO Take by mouth.    . cyclobenzaprine (FLEXERIL) 10 MG tablet Take 1/2 to 1 tablet by mouth up to 3 times a day as needed for muscle spasms 21 tablet 0  . naproxen (NAPROSYN) 500 MG tablet Take 1 tablet (500 mg total) by mouth 2 (two) times daily. 30 tablet 0   No facility-administered medications prior to visit.     Radiology:   No results found.  Cardiac Studies:   None   EKG:   EKG 12/06/2020: Sinus rhythm at a rate of 60 bpm.  Normal axis.  Poor R wave progression, cannot exclude anteroseptal infarct old.  Low voltage complexes.   Assessment     ICD-10-CM   1. Pre-op evaluation  Z01.818 EKG 12-Lead    PCV MYOCARDIAL PERFUSION WITH LEXISCAN    PCV ECHOCARDIOGRAM COMPLETE  2. Nonspecific abnormal electrocardiogram (ECG) (EKG)  R94.31 PCV MYOCARDIAL PERFUSION WITH LEXISCAN    PCV ECHOCARDIOGRAM COMPLETE  3. Decreased exercise tolerance  R68.89 PCV MYOCARDIAL PERFUSION WITH LEXISCAN    PCV ECHOCARDIOGRAM COMPLETE     Medications Discontinued During This Encounter  Medication Reason  . cyclobenzaprine (FLEXERIL) 10 MG tablet Patient  Preference  . naproxen (NAPROSYN) 500 MG tablet Patient Preference    No orders of the defined types were placed in this encounter.   Recommendations:   Paula Ortega is a 49 y.o. caucasian female with history of thyroid cancer and thyroidectomy in 2005. Denies history of hypertension, hyperlipidemia, coronary artery disease, diabetes, TIA/CVA. Patient was adopted and only knows her mother's medical history, denies known family history of premature CAD or heart failure. She has never smoked and occasionally drinks alcohol.   Patient presents for preoperative risk stratification. It is presently difficult to assess patient's activity level has she is limited secondary to left knee  pain. EKG today revealed normal sinus rhythm with poor R wave progression and low voltage complexes. As patient has had decreased exercise tolerance as well as EKG and unknown family history recommend patient undergo stress testing and echocardiogram in order to better risk stratify her for up coming total left knee replacement. Patient expressed frustration that further workup will likely delay her surgery, however she is willing to undergo further cardiac testing. Will do nuclear stress testing with Lexiscan as she cannot tolerate walking on a treadmill due to knee pain. Will also obtain echocardiogram. If echocardiogram and stress testing are without significant abnormalities patient can follow up in our office on an as needed basis. Will plan to submit surgical risk stratification form following cardiac testing.    If echocardiogram and stress testing are without significant abnormalities patient would be low risk from a cardiovascular standpoint for total left knee replacement.   During this visit I reviewed and updated: Tobacco history  allergies medication reconciliation  medical history  surgical history  family history  social history.  This note was created using a voice recognition software as a result there  may be grammatical errors inadvertently enclosed that do not reflect the nature of this encounter. Every attempt is made to correct such errors.   Rayford Halsted, PA-C 12/06/2020, 5:05 PM Office: [phone]

## 2020-12-05 NOTE — Progress Notes (Deleted)
    Patient referred by Orpah Melter, MD for ***  Subjective:   Paula Ortega, female    DOB: Dec 26, 1970, 49 y.o.   MRN: [id number]  *** No chief complaint on file.   *** HPI  49 y.o. *** female with ***  *** Past Medical History:  Diagnosis Date  . Thyroid disease     *** Past Surgical History:  Procedure Laterality Date  . ABLATION    . CESAREAN SECTION    . MENISCECTOMY    . THYROIDECTOMY      *** Social History   Tobacco Use  Smoking Status Never Smoker  Smokeless Tobacco Never Used    Social History   Substance and Sexual Activity  Alcohol Use Yes   Comment: occasionally    *** No family history on file.  *** Current Outpatient Medications on File Prior to Visit  Medication Sig Dispense Refill  . cyclobenzaprine (FLEXERIL) 10 MG tablet Take 1/2 to 1 tablet by mouth up to 3 times a day as needed for muscle spasms 21 tablet 0  . levothyroxine (SYNTHROID, LEVOTHROID) 150 MCG tablet Take 150 mcg by mouth daily before breakfast. Takes 6 days/wk; on 7th day only takes 1.5 pills    . MELOXICAM PO Take by mouth.    . naproxen (NAPROSYN) 500 MG tablet Take 1 tablet (500 mg total) by mouth 2 (two) times daily. 30 tablet 0   No current facility-administered medications on file prior to visit.    Cardiovascular and other pertinent studies:  *** EKG 11/20/2020: ***  *** Recent labs: 11/20/2020: Glucose 103, BUN/Cr 15/0.83. EGFR 74. Na/K 140/3.9. ***Rest of the CMP normal H/H 12.9/39.5. MCV 85.8. Platelets 222 ***HbA1C ***% Chol ***, TG ***, HDL ***, LDL *** ***TSH ***normal   *** ROS      *** There were no vitals filed for this visit.   There is no height or weight on file to calculate BMI. There were no vitals filed for this visit.  *** Objective:   Physical Exam    ***     Assessment & Recommendations:   ***  ***  Thank you for referring the patient to [place]. Please feel free to contact with any questions.   Nigel Mormon, MD Pager: [phone] Office: [phone]

## 2020-12-06 ENCOUNTER — Ambulatory Visit: Payer: BC Managed Care – PPO | Admitting: Student

## 2020-12-06 ENCOUNTER — Encounter: Payer: Self-pay | Admitting: Student

## 2020-12-06 ENCOUNTER — Ambulatory Visit: Payer: Self-pay | Admitting: Cardiology

## 2020-12-06 ENCOUNTER — Other Ambulatory Visit: Payer: Self-pay

## 2020-12-06 VITALS — BP 124/90 | HR 71 | Resp 16 | Ht 68.0 in | Wt 215.0 lb

## 2020-12-06 DIAGNOSIS — Z01818 Encounter for other preprocedural examination: Secondary | ICD-10-CM

## 2020-12-06 DIAGNOSIS — R6889 Other general symptoms and signs: Secondary | ICD-10-CM

## 2020-12-06 DIAGNOSIS — R9431 Abnormal electrocardiogram [ECG] [EKG]: Secondary | ICD-10-CM

## 2020-12-12 ENCOUNTER — Ambulatory Visit: Payer: BC Managed Care – PPO

## 2020-12-12 ENCOUNTER — Other Ambulatory Visit: Payer: Self-pay

## 2020-12-12 DIAGNOSIS — Z01818 Encounter for other preprocedural examination: Secondary | ICD-10-CM

## 2020-12-12 DIAGNOSIS — R9431 Abnormal electrocardiogram [ECG] [EKG]: Secondary | ICD-10-CM

## 2020-12-12 DIAGNOSIS — R6889 Other general symptoms and signs: Secondary | ICD-10-CM

## 2020-12-14 NOTE — Progress Notes (Signed)
Informed patient her echo showed mildly leaky mitral and tricuspid valve, otherwise normal.

## 2020-12-17 ENCOUNTER — Other Ambulatory Visit: Payer: Self-pay

## 2020-12-17 ENCOUNTER — Ambulatory Visit: Payer: BC Managed Care – PPO

## 2020-12-17 DIAGNOSIS — Z01818 Encounter for other preprocedural examination: Secondary | ICD-10-CM

## 2020-12-17 DIAGNOSIS — R6889 Other general symptoms and signs: Secondary | ICD-10-CM

## 2020-12-17 DIAGNOSIS — R9431 Abnormal electrocardiogram [ECG] [EKG]: Secondary | ICD-10-CM

## 2020-12-20 ENCOUNTER — Telehealth: Payer: Self-pay | Admitting: Student

## 2020-12-20 NOTE — Telephone Encounter (Signed)
Called patient and discussed results of stress testing. Advised patient she is still low risk for knee surgery from a cardiac standpoint, however stress test did show concerns for ischemia. Would recommend medical management. Patient verbalized understanding and agreement.   Will schedule for follow up.   Of note discussed patient with Dr. Jacinto Halim who recommended the following: "One option is to clear for surgery, but start medical therapy for abnormal stress test as she is not on any meds. Metoprolol succinate 25, Lipitor 20, ASA 81 mg and see how she does"

## 2020-12-20 NOTE — Progress Notes (Signed)
Spoke with pt regarding results. See telephone encounter.

## 2020-12-28 ENCOUNTER — Ambulatory Visit: Payer: BC Managed Care – PPO | Admitting: Student

## 2020-12-28 ENCOUNTER — Other Ambulatory Visit: Payer: Self-pay

## 2020-12-28 ENCOUNTER — Encounter: Payer: Self-pay | Admitting: Student

## 2020-12-28 VITALS — BP 131/89 | HR 86 | Temp 98.1°F | Resp 16 | Ht 68.0 in | Wt 209.8 lb

## 2020-12-28 DIAGNOSIS — R9439 Abnormal result of other cardiovascular function study: Secondary | ICD-10-CM

## 2020-12-28 DIAGNOSIS — Z01818 Encounter for other preprocedural examination: Secondary | ICD-10-CM

## 2020-12-28 MED ORDER — ASPIRIN EC 81 MG PO TBEC
81.0000 mg | DELAYED_RELEASE_TABLET | Freq: Every day | ORAL | 3 refills | Status: AC
Start: 2020-12-28 — End: ?

## 2020-12-28 MED ORDER — ATORVASTATIN CALCIUM 20 MG PO TABS: 20.0000 mg | ORAL_TABLET | Freq: Every day | ORAL | 3 refills | Status: DC

## 2020-12-28 MED ORDER — METOPROLOL SUCCINATE ER 50 MG PO TB24: 50.0000 mg | ORAL_TABLET | Freq: Every day | ORAL | 3 refills | Status: DC

## 2020-12-28 NOTE — Progress Notes (Signed)
Primary Physician/Referring:  Joycelyn Rua, MD  Patient ID: Paula Ortega, female    DOB: 1971-04-05, 50 y.o.   MRN: [id number]  Chief Complaint  Patient presents with   Follow-up   Results   HPI:    Paula Ortega  is a 50 y.o. caucasian female with history of thyroid cancer and thyroidectomy in 2005. Denies history of hypertension, hyperlipidemia, coronary artery disease, diabetes, TIA/CVA. Patient was adopted and only knows her mother's medical history, denies known family history of premature CAD or heart failure. She has never smoked and occasionally drinks alcohol.   Patient presents for 1 month follow up to discuss results of cardiac testing.  She was originally referred to the office for preoperative risk stratification prior to left total knee replacement under general anesthesia.  Patient's EKG at last visit revealed poor R wave progression and low voltage complexes, and patient had reduced exercise capacity, therefore she underwent stress testing and echocardiogram.  Echocardiogram was normal, however stress test provoked chest pain and abnormal EKG response. Patient denies chest pain, palpitations, dyspnea, syncope, near syncope, dizziness, leg swelling, PND, orthopnea.   Past Medical History:  Diagnosis Date   Thyroid disease    Past Surgical History:  Procedure Laterality Date   ABLATION     uterine   CESAREAN SECTION     MENISCECTOMY     THYROIDECTOMY     History reviewed. No pertinent family history.  Social History   Tobacco Use   Smoking status: Never Smoker   Smokeless tobacco: Never Used  Substance Use Topics   Alcohol use: Yes    Comment: occasionally   Marital Status: Married   ROS  Review of Systems  Constitutional: Positive for weight gain. Negative for malaise/fatigue.  Cardiovascular: Negative for chest pain, claudication, leg swelling, near-syncope, orthopnea, palpitations, paroxysmal nocturnal dyspnea and syncope.  Respiratory:  Negative for shortness of breath.   Hematologic/Lymphatic: Does not bruise/bleed easily.  Musculoskeletal: Positive for joint pain (left knee) and joint swelling (left knee).  Gastrointestinal: Negative for melena.  Neurological: Negative for dizziness and weakness.    Objective  Blood pressure 131/89, pulse 86, temperature 98.1 F (36.7 C), temperature source Temporal, resp. rate 16, height 5\' 8"  (1.727 m), weight 209 lb 12.8 oz (95.2 kg), SpO2 99 %.  Vitals with BMI 12/28/2020 12/06/2020 12/06/2020  Height 5\' 8"  - 5\' 8"   Weight 209 lbs 13 oz - 215 lbs  BMI 31.91 - 32.7  Systolic 131 124 12/08/2020  Diastolic 89 90 90  Pulse 86 71 75   Body mass index is 31.9 kg/m.    Physical Exam Vitals reviewed.  Constitutional:      General: She is not in acute distress.    Appearance: Normal appearance. She is obese. She is not ill-appearing.  HENT:     Head: Normocephalic and atraumatic.  Cardiovascular:     Rate and Rhythm: Normal rate and regular rhythm.     Pulses: Intact distal pulses.     Heart sounds: S1 normal and S2 normal. No murmur heard. No gallop.   Pulmonary:     Effort: Pulmonary effort is normal. No respiratory distress.     Breath sounds: No wheezing, rhonchi or rales.  Musculoskeletal:     Right lower leg: No edema.     Left lower leg: No edema.  Skin:    General: Skin is warm and dry.  Neurological:     Mental Status: She is alert.     Laboratory  examination:   No results for input(s): NA, K, CL, CO2, GLUCOSE, BUN, CREATININE, CALCIUM, GFRNONAA, GFRAA in the last 8760 hours. CrCl cannot be calculated (No successful lab value found.).  No flowsheet data found. No flowsheet data found.  Lipid Panel No results for input(s): CHOL, TRIG, LDLCALC, VLDL, HDL, CHOLHDL, LDLDIRECT in the last 8760 hours.  HEMOGLOBIN A1C No results found for: HGBA1C, MPG TSH No results for input(s): TSH in the last 8760 hours.  External labs:   11/21/2020: Hemoglobin 12.9,  hematocrit 39.5, MCV 85.8, platelet 222 Glucose 103, BUN 15, creatinine 0.83, GFR 74, sodium 140, potassium 3.9  Medications and allergies  No Known Allergies   Outpatient Medications Prior to Visit  Medication Sig Dispense Refill   COLLAGEN-VITAMIN C PO Take 2.5 g by mouth.     levothyroxine (SYNTHROID, LEVOTHROID) 150 MCG tablet Take 125 mcg by mouth daily before breakfast. Takes 6 days/wk; on 7th day only takes 1 pills     MELOXICAM PO Take by mouth.     No facility-administered medications prior to visit.     Radiology:   No results found.  Cardiac Studies:   PCV MYOCARDIAL PERFUSION WITH LEXISCAN 12/17/2020 Equivocal ECG stress. The patient was injected with 0.4 mg of Intravenous Lexiscan over 15 sec. Additionally, patient exercised on a Modified Bruce protocol achieving 69% of MPHR. Resting EKG demonstrated normal sinus rhythm. Peak EKG revealed greater than 1 mm ST depression present. With Lexiscan infusion, patient experienced chest pressure 3/10. Myocardial perfusion is abnormal. There is a fixed mild defect in the inferior and apical regions consistent with breast attenuation.  No ischemia or scar. Overall LV systolic function is abnormal without regional wall motion abnormalities. Stress LV EF: 46%, however, visually LVEF appears normal. Intermediate risk study in view of abnormal EKG response, chest pain and abnormal calculated LVEF. No previous exam available for comparison.  PCV ECHOCARDIOGRAM COMPLETE 12/12/2020 Left ventricle cavity is normal in size and wall thickness. Normal global wall motion. Normal LV systolic function with EF 60%. Normal diastolic filling pattern. Mild (Grade I) mitral regurgitation. Mild tricuspid regurgitation. No evidence of pulmonary hypertension.  EKG:   EKG 12/06/2020: Sinus rhythm at a rate of 60 bpm.  Normal axis.  Poor R wave progression, cannot exclude anteroseptal infarct old.  Low voltage complexes.   Assessment      ICD-10-CM   1. Abnormal cardiovascular stress test  R94.39 aspirin EC 81 MG tablet    metoprolol succinate (TOPROL-XL) 50 MG 24 hr tablet    atorvastatin (LIPITOR) 20 MG tablet  2. Pre-op evaluation  Z01.818      There are no discontinued medications.  Meds ordered this encounter  Medications   aspirin EC 81 MG tablet    Sig: Take 1 tablet (81 mg total) by mouth daily. Swallow whole.    Dispense:  30 tablet    Refill:  3   metoprolol succinate (TOPROL-XL) 50 MG 24 hr tablet    Sig: Take 1 tablet (50 mg total) by mouth daily. Take with or immediately following a meal.    Dispense:  30 tablet    Refill:  3   atorvastatin (LIPITOR) 20 MG tablet    Sig: Take 1 tablet (20 mg total) by mouth daily.    Dispense:  30 tablet    Refill:  3    Recommendations:   Paula Ortega is a 51 y.o. caucasian female with history of thyroid cancer and thyroidectomy in 2005. Denies history of hypertension,  hyperlipidemia, coronary artery disease, diabetes, TIA/CVA. Patient was adopted and only knows her mother's medical history, denies known family history of premature CAD or heart failure. She has never smoked and occasionally drinks alcohol.   Patient presents for 1 month follow-up of cardiac testing results and preoperative risk stratification.  Reviewed and discussed with patient results of echocardiogram and stress test.  Stress test did reveal ST depressions at peak exercise as well as patient reported chest pain and calculated LVEF of 46%.  Of note visual LVEF was normal and LVEF per echocardiogram was 60%.  In view of intermediate risk stress test recommend medical therapy for coronary artery disease for risk management including metoprolol succinate 25 mg daily, Lipitor 20 mg daily, and aspirin 81 mg daily. Patient is agreeable to medical management and will start metoprolol, Lipitor, however she will hold off on starting Asprin until after her knee surgery. Patient will notify the office is she  experiences any side effects of medications.   In regard to perioperative restratification, patient understands she is at higher risk compared to peers of same sex and age without abnormal stress test findings.  However cardiac risk is not prohibitive of proceeding with left knee replacement at this time.  Follow up in 3 months.    Rayford Halsted, PA-C 12/28/2020, 11:41 AM Office: [phone]

## 2021-01-08 HISTORY — PX: KNEE SURGERY: SHX244

## 2021-03-24 ENCOUNTER — Other Ambulatory Visit: Payer: Self-pay | Admitting: Student

## 2021-03-24 DIAGNOSIS — R9439 Abnormal result of other cardiovascular function study: Secondary | ICD-10-CM

## 2021-03-27 NOTE — Progress Notes (Signed)
Primary Physician/Referring:  Joycelyn Rua, MD  Patient ID: Paula Ortega, female    DOB: June 04, 1971, 50 y.o.   MRN: [id number]  Chief Complaint  Patient presents with  . Coronary Artery Disease  . Follow-up    3 month  . Results    Stress    HPI:    Paula Ortega  is a 50 y.o. caucasian female with history of thyroid cancer and thyroidectomy in 2005. Denies history of hypertension, hyperlipidemia, coronary artery disease, diabetes, TIA/CVA. Patient was adopted and only knows her mother's medical history, denies known family history of premature CAD or heart failure. She has never smoked and occasionally drinks alcohol.   Patient was originally referred to our office for preoperative risk evaluation. She underwent echocardiogram revealed normal LVEF and intermediate risk stress test due to reported chest pain and ST depression at peak exercise.   Patient presents for 3 month follow up.  At last visit started patient on metoprolol 25 mg daily and Lipitor 20 mg daily, as well as advised her to start aspirin 81 mg daily following knee surgery.  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  Patient states she finished PT approximately 2 weeks ago.  She has started taking aspirin 81 mg daily.  She continues to tolerate atorvastatin and metoprolol as well without issue.  She is currently taking Celebrex daily for her knee.  She is slowly increasing physical activity.  Patient denies chest pain, palpitations, dyspnea, syncope, near syncope, dizziness, PND, orthopnea.  She does have left knee and ankle swelling since surgery.  Past Medical History:  Diagnosis Date  . Hypertension   . Thyroid disease    Past Surgical History:  Procedure Laterality Date  . ABLATION     uterine  . CESAREAN SECTION    . MENISCECTOMY    . THYROIDECTOMY     History reviewed. No pertinent family history.  Social History   Tobacco Use  . Smoking status: Never  Smoker  . Smokeless tobacco: Never Used  Substance Use Topics  . Alcohol use: Yes    Comment: occasionally   Marital Status: Married   ROS  Review of Systems  Constitutional: Positive for weight gain. Negative for malaise/fatigue.  Cardiovascular: Negative for chest pain, claudication, leg swelling, near-syncope, orthopnea, palpitations, paroxysmal nocturnal dyspnea and syncope.  Respiratory: Negative for shortness of breath.   Hematologic/Lymphatic: Does not bruise/bleed easily.  Musculoskeletal: Positive for joint pain (left knee) and joint swelling (left knee).  Gastrointestinal: Negative for melena.  Neurological: Negative for dizziness and weakness.    Objective  Blood pressure 127/86, pulse 71, temperature 97.8 F (36.6 C), resp. rate 16, height 5\' 8"  (1.727 m), weight 217 lb (98.4 kg), SpO2 100 %.  Vitals with BMI 03/28/2021 12/28/2020 12/06/2020  Height 5\' 8"  5\' 8"  -  Weight 217 lbs 209 lbs 13 oz -  BMI 33 31.91 -  Systolic 127 131 12/08/2020  Diastolic 86 89 90  Pulse 71 86 71   Body mass index is 32.99 kg/m.    Physical Exam Vitals reviewed.  Constitutional:      General: She is not in acute distress.    Appearance: Normal appearance. She is obese. She is not ill-appearing.  HENT:     Head: Normocephalic and atraumatic.  Cardiovascular:     Rate and Rhythm: Normal rate and regular rhythm.     Pulses: Intact distal pulses.     Heart sounds: S1 normal  and S2 normal. No murmur heard. No gallop.   Pulmonary:     Effort: Pulmonary effort is normal. No respiratory distress.     Breath sounds: No wheezing, rhonchi or rales.  Musculoskeletal:        General: Swelling (left knee) present.     Right lower leg: No edema.     Left lower leg: No edema.  Skin:    General: Skin is warm and dry.  Neurological:     General: No focal deficit present.     Mental Status: She is alert and oriented to person, place, and time.     Laboratory examination:   No results for  input(s): NA, K, CL, CO2, GLUCOSE, BUN, CREATININE, CALCIUM, GFRNONAA, GFRAA in the last 8760 hours. CrCl cannot be calculated (No successful lab value found.).  No flowsheet data found. No flowsheet data found.  Lipid Panel No results for input(s): CHOL, TRIG, LDLCALC, VLDL, HDL, CHOLHDL, LDLDIRECT in the last 8760 hours.  HEMOGLOBIN A1C No results found for: HGBA1C, MPG TSH No results for input(s): TSH in the last 8760 hours.  External labs:   11/21/2020: Hemoglobin 12.9, hematocrit 39.5, MCV 85.8, platelet 222 Glucose 103, BUN 15, creatinine 0.83, GFR 74, sodium 140, potassium 3.9  Medications and allergies  No Known Allergies   Outpatient Medications Prior to Visit  Medication Sig Dispense Refill  . aspirin EC 81 MG tablet Take 1 tablet (81 mg total) by mouth daily. Swallow whole. 30 tablet 3  . Calcium Carbonate (CALCIUM 500 PO) Take 1 tablet by mouth daily at 6 (six) AM.    . celecoxib (CELEBREX) 200 MG capsule as needed.    . COLLAGEN-VITAMIN C PO Take 2.5 g by mouth.    . [facility] ACETAMINOPHEN EX ST 500 MG tablet as needed.    Marland Kitchen levothyroxine (SYNTHROID, LEVOTHROID) 150 MCG tablet Take 125 mcg by mouth daily before breakfast. Takes 6 days/wk; on 7th day only takes 1 pills    . Multiple Vitamin (MULTIVITAMIN) tablet Take 1 tablet by mouth daily.    Marland Kitchen atorvastatin (LIPITOR) 20 MG tablet TAKE 1 TABLET BY MOUTH EVERY DAY 90 tablet 1  . metoprolol succinate (TOPROL-XL) 50 MG 24 hr tablet TAKE 1 TABLET BY MOUTH DAILY. TAKE WITH OR IMMEDIATELY FOLLOWING A MEAL. 90 tablet 1  . MELOXICAM PO Take by mouth.     No facility-administered medications prior to visit.     Radiology:   No results found.  Cardiac Studies:   PCV MYOCARDIAL PERFUSION WITH LEXISCAN 12/17/2020 Equivocal ECG stress. The patient was injected with 0.4 mg of Intravenous Lexiscan over 15 sec. Additionally, patient exercised on a Modified Bruce protocol achieving 69% of MPHR. Resting EKG demonstrated normal  sinus rhythm. Peak EKG revealed greater than 1 mm ST depression present. With Lexiscan infusion, patient experienced chest pressure 3/10. Myocardial perfusion is abnormal. There is a fixed mild defect in the inferior and apical regions consistent with breast attenuation.  No ischemia or scar. Overall LV systolic function is abnormal without regional wall motion abnormalities. Stress LV EF: 46%, however, visually LVEF appears normal. Intermediate risk study in view of abnormal EKG response, chest pain and abnormal calculated LVEF. No previous exam available for comparison.  PCV ECHOCARDIOGRAM COMPLETE 12/12/2020 Left ventricle cavity is normal in size and wall thickness. Normal global wall motion. Normal LV systolic function with EF 60%. Normal diastolic filling pattern. Mild (Grade I) mitral regurgitation. Mild tricuspid regurgitation. No evidence of pulmonary hypertension.  EKG:  EKG 03/28/2021: Sinus rhythm at a rate of 62 bpm.  Normal axis.  Low voltage complexes.  No evidence of ischemia or underlying injury pattern.  Compared to EKG 12/06/2020, PR WP not present.  Assessment     ICD-10-CM   1. Abnormal cardiovascular stress test  R94.39 EKG 12-Lead    atorvastatin (LIPITOR) 20 MG tablet    metoprolol succinate (TOPROL-XL) 50 MG 24 hr tablet  2. Nonspecific abnormal electrocardiogram (ECG) (EKG)  R94.31   3. Decreased exercise tolerance  R68.89      Medications Discontinued During This Encounter  Medication Reason  . MELOXICAM PO Error  . metoprolol succinate (TOPROL-XL) 50 MG 24 hr tablet Reorder  . atorvastatin (LIPITOR) 20 MG tablet Reorder    Meds ordered this encounter  Medications  . atorvastatin (LIPITOR) 20 MG tablet    Sig: Take 1 tablet (20 mg total) by mouth daily.    Dispense:  90 tablet    Refill:  3    DX Code Needed  .  . metoprolol succinate (TOPROL-XL) 50 MG 24 hr tablet    Sig: Take 1 tablet (50 mg total) by mouth daily. TAKE WITH OR IMMEDIATELY FOLLOWING  A MEAL.    Dispense:  90 tablet    Refill:  3    DX Code Needed  .    Recommendations:   Paula Ortega is a 50 y.o. caucasian female with history of thyroid cancer and thyroidectomy in 2005. Denies history of hypertension, hyperlipidemia, coronary artery disease, diabetes, TIA/CVA. Patient was adopted and only knows her mother's medical history, denies known family history of premature CAD or heart failure. She has never smoked and occasionally drinks alcohol.   Patient was originally referred to our office for preoperative risk evaluation. She underwent echocardiogram revealed normal LVEF and intermediate risk stress test due to reported chest pain and ST depression at peak exercise.   Patient presents for 3 month follow up.  At last visit started patient on metoprolol 25 mg daily and Lipitor 20 mg daily, as well as advised her to start aspirin 81 mg daily following knee surgery.  Patient is tolerating medications without issue and remains asymptomatic.  Recommend continued primary prevention and management of cardiovascular risk factors.  Encourage patient to continue to increase her exercise as well as make diet and lifestyle modifications for weight loss.  Follow-up in 1 year, sooner if needed, for primary prevention.  Patient is stable at this time would recommend as needed follow-up and defer management to PCP.   Rayford Halsted, PA-C 03/28/2021, 10:41 AM Office: [phone]

## 2021-03-28 ENCOUNTER — Ambulatory Visit: Payer: BC Managed Care – PPO | Admitting: Student

## 2021-03-28 ENCOUNTER — Other Ambulatory Visit: Payer: Self-pay

## 2021-03-28 ENCOUNTER — Encounter: Payer: Self-pay | Admitting: Student

## 2021-03-28 VITALS — BP 127/86 | HR 71 | Temp 97.8°F | Resp 16 | Ht 68.0 in | Wt 217.0 lb

## 2021-03-28 DIAGNOSIS — R9439 Abnormal result of other cardiovascular function study: Secondary | ICD-10-CM

## 2021-03-28 DIAGNOSIS — R6889 Other general symptoms and signs: Secondary | ICD-10-CM

## 2021-03-28 DIAGNOSIS — R9431 Abnormal electrocardiogram [ECG] [EKG]: Secondary | ICD-10-CM

## 2021-03-28 MED ORDER — ATORVASTATIN CALCIUM 20 MG PO TABS: 1.0000 | ORAL_TABLET | Freq: Every day | ORAL | 3 refills | Status: DC

## 2021-03-28 MED ORDER — METOPROLOL SUCCINATE ER 50 MG PO TB24: 50.0000 mg | ORAL_TABLET | Freq: Every day | ORAL | 3 refills | Status: DC

## 2021-11-05 ENCOUNTER — Ambulatory Visit: Payer: BC Managed Care – PPO | Admitting: Student

## 2021-11-05 ENCOUNTER — Telehealth: Payer: Self-pay

## 2021-11-05 ENCOUNTER — Encounter: Payer: Self-pay | Admitting: Student

## 2021-11-05 ENCOUNTER — Other Ambulatory Visit: Payer: Self-pay

## 2021-11-05 VITALS — BP 141/92 | HR 77 | Temp 98.1°F | Resp 14 | Ht 68.0 in | Wt 218.2 lb

## 2021-11-05 DIAGNOSIS — R072 Precordial pain: Secondary | ICD-10-CM

## 2021-11-05 NOTE — Telephone Encounter (Signed)
Patient has been added to CC scheduled and patient is aware

## 2021-11-05 NOTE — Telephone Encounter (Signed)
If she can come at 3:15pm today I can see her today. Otherwise would recommend urgent care or ED for further evaluation.

## 2021-11-05 NOTE — Progress Notes (Signed)
Primary Physician/Referring:  Orpah Melter, MD  Patient ID: Paula Ortega, female    DOB: [birth year], 50 y.o.   MRN: [id number]  Chief Complaint  Patient presents with   Chest Pain   HPI:    Paula Ortega  is a 50 y.o. caucasian female with history of thyroid cancer and thyroidectomy in 2005. Denies history of hypertension, hyperlipidemia, coronary artery disease, diabetes, TIA/CVA. Patient was adopted and only knows her mother's medical history, denies known family history of premature CAD or heart failure. She has never smoked and occasionally drinks alcohol.   Presents for urgent visit at her request with complaints of headache and nausea as well as sinus pressure over the last 1 week.  She also reports ear pain ongoing for the last 3 to 4 weeks.  2 days ago patient had an episode of chest pain at night while in bed lasting approximately 30 minutes.  Denies associated symptoms.  Given chest pain patient requested to be seen in our office.  Denies dyspnea, syncope, near syncope, palpitations, dizziness, orthopnea, PND.  Past Medical History:  Diagnosis Date   Hypertension    Thyroid disease    Past Surgical History:  Procedure Laterality Date   ABLATION     uterine   CESAREAN SECTION     MENISCECTOMY     THYROIDECTOMY     History reviewed. No pertinent family history.  Social History   Tobacco Use   Smoking status: Never   Smokeless tobacco: Never  Substance Use Topics   Alcohol use: Yes    Comment: occasionally   Marital Status: Married   ROS  Review of Systems  Constitutional: Positive for weight gain. Negative for malaise/fatigue.  HENT:  Positive for congestion and ear pain.   Cardiovascular:  Negative for chest pain, claudication, leg swelling, near-syncope, orthopnea, palpitations, paroxysmal nocturnal dyspnea and syncope.  Respiratory:  Negative for shortness of breath.   Hematologic/Lymphatic: Does not bruise/bleed easily.  Musculoskeletal:  Positive for  joint pain (left knee) and joint swelling (left knee).  Gastrointestinal:  Negative for melena.  Neurological:  Positive for headaches. Negative for dizziness and weakness.   Objective  Blood pressure (!) 141/92, pulse 77, temperature 98.1 F (36.7 C), temperature source Temporal, resp. rate 14, height 5\' 8"  (1.727 m), weight 218 lb 3.2 oz (99 kg), SpO2 100 %.  Vitals with BMI 11/05/2021 03/28/2021 12/28/2020  Height 5\' 8"  5\' 8"  5\' 8"   Weight 218 lbs 3 oz 217 lbs 209 lbs 13 oz  BMI [id number] 33 0000000  Systolic [id number]  Diastolic 92 86 89  Pulse 77 71 86   Body mass index is 33.18 kg/m.    Physical Exam Vitals reviewed.  Constitutional:      General: She is not in acute distress.    Appearance: Normal appearance. She is obese. She is not ill-appearing.  Cardiovascular:     Rate and Rhythm: Normal rate and regular rhythm.     Pulses: Intact distal pulses.     Heart sounds: S1 normal and S2 normal. No murmur heard.   No gallop.  Pulmonary:     Effort: Pulmonary effort is normal. No respiratory distress.     Breath sounds: No wheezing, rhonchi or rales.  Musculoskeletal:        General: Swelling (left knee) present.     Right lower leg: No edema.     Left lower leg: No edema.  Skin:    General: Skin is warm and  dry.  Neurological:     Mental Status: She is alert.  Patient's physical exam unchanged compared to last office visit.  Laboratory examination:   No results for input(s): NA, K, CL, CO2, GLUCOSE, BUN, CREATININE, CALCIUM, GFRNONAA, GFRAA in the last 8760 hours. CrCl cannot be calculated (No successful lab value found.).  No flowsheet data found. No flowsheet data found.  Lipid Panel No results for input(s): CHOL, TRIG, LDLCALC, VLDL, HDL, CHOLHDL, LDLDIRECT in the last 8760 hours.  HEMOGLOBIN A1C No results found for: HGBA1C, MPG TSH No results for input(s): TSH in the last 8760 hours.  External labs:   11/21/2020: Hemoglobin 12.9, hematocrit 39.5, MCV  85.8, platelet 222 Glucose 103, BUN 15, creatinine 0.83, GFR 74, sodium 140, potassium 3.9  Allergies  No Known Allergies    Medications Prior to Visit:   Outpatient Medications Prior to Visit  Medication Sig Dispense Refill   aspirin EC 81 MG tablet Take 1 tablet (81 mg total) by mouth daily. Swallow whole. 30 tablet 3   atorvastatin (LIPITOR) 20 MG tablet Take 1 tablet (20 mg total) by mouth daily. 90 tablet 3   Calcium Carbonate (CALCIUM 500 PO) Take 1 tablet by mouth daily at 6 (six) AM.     celecoxib (CELEBREX) 200 MG capsule as needed.     COLLAGEN-VITAMIN C PO Take 2.5 g by mouth.     [facility] ACETAMINOPHEN EX ST 500 MG tablet as needed.     levothyroxine (SYNTHROID, LEVOTHROID) 150 MCG tablet Take 125 mcg by mouth daily before breakfast. Takes 6 days/wk; on 7th day only takes 1 pills     metoprolol succinate (TOPROL-XL) 50 MG 24 hr tablet Take 1 tablet (50 mg total) by mouth daily. TAKE WITH OR IMMEDIATELY FOLLOWING A MEAL. 90 tablet 3   Multiple Vitamin (MULTIVITAMIN) tablet Take 1 tablet by mouth daily.     No facility-administered medications prior to visit.   Final Medications at End of Visit    No outpatient medications have been marked as taking for the 11/05/21 encounter (Office Visit) with Carlynn Purl, Dabria Wadas C, PA-C.   Radiology:   No results found.  Cardiac Studies:   PCV MYOCARDIAL PERFUSION WITH LEXISCAN 12/17/2020 Equivocal ECG stress. The patient was injected with 0.4 mg of Intravenous Lexiscan over 15 sec. Additionally, patient exercised on a Modified Bruce protocol achieving 69% of MPHR. Resting EKG demonstrated normal sinus rhythm. Peak EKG revealed greater than 1 mm ST depression present. With Lexiscan infusion, patient experienced chest pressure 3/10. Myocardial perfusion is abnormal. There is a fixed mild defect in the inferior and apical regions consistent with breast attenuation.  No ischemia or scar. Overall LV systolic function is abnormal without regional  wall motion abnormalities. Stress LV EF: 46%, however, visually LVEF appears normal. Intermediate risk study in view of abnormal EKG response, chest pain and abnormal calculated LVEF. No previous exam available for comparison.  PCV ECHOCARDIOGRAM COMPLETE 12/12/2020 Left ventricle cavity is normal in size and wall thickness. Normal global wall motion. Normal LV systolic function with EF 60%. Normal diastolic filling pattern. Mild (Grade I) mitral regurgitation. Mild tricuspid regurgitation. No evidence of pulmonary hypertension.  EKG:   11/05/2021: Sinus rhythm at a rate of 71 bpm.  Normal axis.  No evidence of ischemia or underlying injury pattern.  Compared to EKG 03/28/2021, no significant change.  Assessment     ICD-10-CM   1. Precordial pain  R07.2 EKG 12-Lead       There are no discontinued medications.  No orders of the defined types were placed in this encounter.   Recommendations:   Paula Ortega is a 50 y.o. caucasian female with history of thyroid cancer and thyroidectomy in 2005. Denies history of hypertension, hyperlipidemia, coronary artery disease, diabetes, TIA/CVA. Patient was adopted and only knows her mother's medical history, denies known family history of premature CAD or heart failure. She has never smoked and occasionally drinks alcohol.   Patient presents for urgent visit with concerns of chest pain as well as sinus pressure, headaches, nausea.  EKG is nonischemic and again reviewed stress test and echocardiogram with patient.  Patient's chest pain symptoms are likely noncardiac and suspect her headaches and sensation of pressure and ear pain are related to infectious process.  Therefore recommended patient follow-up with PCP or urgent care for further evaluation and management.  Reassured her from a cardiovascular standpoint.  We will however obtain coronary calcium score for further risk stratification.  We will follow-up as scheduled previously.  Counseled  patient regarding signs and symptoms that would warrant urgent or emergent evaluation, she verbalized understanding agreement.   Alethia Berthold, PA-C 11/05/2021, 4:52 PM Office: [phone]

## 2021-11-26 ENCOUNTER — Other Ambulatory Visit: Payer: Self-pay | Admitting: Student

## 2021-11-26 DIAGNOSIS — I2510 Atherosclerotic heart disease of native coronary artery without angina pectoris: Secondary | ICD-10-CM

## 2021-11-28 ENCOUNTER — Ambulatory Visit
Admission: RE | Admit: 2021-11-28 | Discharge: 2021-11-28 | Disposition: A | Discharge status: Home / Self Care | Payer: No Typology Code available for payment source | Source: Ambulatory Visit | Attending: Student | Admitting: Student

## 2021-11-28 DIAGNOSIS — I2510 Atherosclerotic heart disease of native coronary artery without angina pectoris: Secondary | ICD-10-CM

## 2021-12-03 NOTE — Progress Notes (Signed)
Called and spoke to pt, pt voiced understanding.

## 2022-03-26 NOTE — Progress Notes (Signed)
? ?Primary Physician/Referring:  Orpah Melter, MD ? ?Patient ID: Paula Ortega, female    DOB: 10-04-1971, 51 y.o.   MRN: [id number] ? ?Chief Complaint  ?Patient presents with  ? Hypertension  ? Follow-up  ? ?HPI:   ? ?Paula Ortega  is a 51 y.o. caucasian female with history of thyroid cancer and thyroidectomy in 2005. Denies history of hypertension, hyperlipidemia, coronary artery disease, diabetes, TIA/CVA. Patient was adopted and only knows her mother's medical history, denies known family history of premature CAD or heart failure. She has never smoked and occasionally drinks alcohol.  ? ?Patient presents for follow-up.  At last office visit given chest pain ordered coronary calcium score, which revealed a total coronary calcium score of 0.  Patient is feeling quite well at today's office visit without specific complaints.  She has had no recurrence of chest pain.  She continues to increase physical activity. ? ?Denies dyspnea, syncope, near syncope, palpitations, dizziness, orthopnea, PND. ? ?Past Medical History:  ?Diagnosis Date  ? Hypertension   ? Thyroid disease   ? ?Past Surgical History:  ?Procedure Laterality Date  ? ABLATION    ? uterine  ? CESAREAN SECTION    ? KNEE SURGERY Left 01/2021  ? MENISCECTOMY    ? THYROIDECTOMY    ? ?Family History  ?Adopted: Yes  ?  ?Social History  ? ?Tobacco Use  ? Smoking status: Never  ? Smokeless tobacco: Never  ?Substance Use Topics  ? Alcohol use: Yes  ?  Comment: occasionally  ? ?Marital Status: Married  ? ?ROS  ?Review of Systems  ?Constitutional: Negative for malaise/fatigue and weight gain.  ?Cardiovascular:  Negative for chest pain, claudication, dyspnea on exertion, leg swelling, near-syncope, orthopnea, palpitations, paroxysmal nocturnal dyspnea and syncope.  ?Neurological:  Negative for dizziness.  ? ?Objective  ?Blood pressure (!) 133/98, pulse 65, temperature 98 ?F (36.7 ?C), resp. rate 16, height 5\' 8"  (1.727 m), weight 212 lb (96.2 kg), SpO2 95 %.  ? ?   03/28/2022  ?  8:29 AM 03/28/2022  ?  8:24 AM 11/05/2021  ?  3:08 PM  ?Vitals with BMI  ?Height  5\' 8"  5\' 8"   ?Weight  212 lbs 218 lbs 3 oz  ?BMI  32.24 33.18  ?Systolic Q000111Q [id number] Q000111Q  ?Diastolic 98 89 92  ?Pulse 65 68 77  ? Body mass index is 32.23 kg/m?. ? ? ? Physical Exam ?Vitals reviewed.  ?Constitutional:   ?   General: She is not in acute distress. ?   Appearance: Normal appearance. She is obese. She is not ill-appearing.  ?Cardiovascular:  ?   Rate and Rhythm: Normal rate and regular rhythm.  ?   Pulses: Intact distal pulses.  ?   Heart sounds: S1 normal and S2 normal. No murmur heard. ?  No gallop.  ?Pulmonary:  ?   Effort: Pulmonary effort is normal. No respiratory distress.  ?   Breath sounds: No wheezing, rhonchi or rales.  ?Musculoskeletal:  ?   Right lower leg: No edema.  ?   Left lower leg: No edema.  ?Skin: ?   General: Skin is warm and dry.  ?Neurological:  ?   Mental Status: She is alert.  ? ?Laboratory examination:  ? ?No results for input(s): NA, K, CL, CO2, GLUCOSE, BUN, CREATININE, CALCIUM, GFRNONAA, GFRAA in the last 8760 hours. ?CrCl cannot be calculated (No successful lab value found.).  ?   ? View : No data to display.  ?  ?  ?  ? ?   ?  View : No data to display.  ?  ?  ?  ? ? ?Lipid Panel ?No results for input(s): CHOL, TRIG, LDLCALC, VLDL, HDL, CHOLHDL, LDLDIRECT in the last 8760 hours. ? ?HEMOGLOBIN A1C ?No results found for: HGBA1C, MPG ?TSH ?No results for input(s): TSH in the last 8760 hours. ? ?External labs:  ? ?11/21/2020: ?Hemoglobin 12.9, hematocrit 39.5, MCV 85.8, platelet 222 ?Glucose 103, BUN 15, creatinine 0.83, GFR 74, sodium 140, potassium 3.9 ? ?Allergies  ?No Known Allergies  ? ?Medications Prior to Visit:  ? ?Outpatient Medications Prior to Visit  ?Medication Sig Dispense Refill  ? aspirin EC 81 MG tablet Take 1 tablet (81 mg total) by mouth daily. Swallow whole. 30 tablet 3  ? atorvastatin (LIPITOR) 20 MG tablet Take 1 tablet (20 mg total) by mouth daily. 90 tablet 3  ?  Calcium Carbonate (CALCIUM 500 PO) Take 1 tablet by mouth daily at 6 (six) AM.    ? calcium gluconate 500 MG tablet Take 1 tablet by mouth 3 (three) times daily.    ? COLLAGEN-VITAMIN C PO Take 2.5 g by mouth.    ? [facility] ACETAMINOPHEN EX ST 500 MG tablet as needed.    ? levothyroxine (SYNTHROID, LEVOTHROID) 150 MCG tablet Take 125 mcg by mouth daily before breakfast. Takes 6 days/wk; on 7th day only takes 1 pills    ? metoprolol succinate (TOPROL-XL) 50 MG 24 hr tablet Take 1 tablet (50 mg total) by mouth daily. TAKE WITH OR IMMEDIATELY FOLLOWING A MEAL. 90 tablet 3  ? Multiple Vitamin (MULTIVITAMIN) tablet Take 1 tablet by mouth daily.    ? celecoxib (CELEBREX) 200 MG capsule as needed.    ? ?No facility-administered medications prior to visit.  ? ?Final Medications at End of Visit   ? ?Current Meds  ?Medication Sig  ? aspirin EC 81 MG tablet Take 1 tablet (81 mg total) by mouth daily. Swallow whole.  ? atorvastatin (LIPITOR) 20 MG tablet Take 1 tablet (20 mg total) by mouth daily.  ? Calcium Carbonate (CALCIUM 500 PO) Take 1 tablet by mouth daily at 6 (six) AM.  ? calcium gluconate 500 MG tablet Take 1 tablet by mouth 3 (three) times daily.  ? COLLAGEN-VITAMIN C PO Take 2.5 g by mouth.  ? [facility] ACETAMINOPHEN EX ST 500 MG tablet as needed.  ? levothyroxine (SYNTHROID, LEVOTHROID) 150 MCG tablet Take 125 mcg by mouth daily before breakfast. Takes 6 days/wk; on 7th day only takes 1 pills  ? metoprolol succinate (TOPROL-XL) 50 MG 24 hr tablet Take 1 tablet (50 mg total) by mouth daily. TAKE WITH OR IMMEDIATELY FOLLOWING A MEAL.  ? Multiple Vitamin (MULTIVITAMIN) tablet Take 1 tablet by mouth daily.  ? ?Radiology:  ? ?No results found. ? ?Cardiac Studies:  ?CORONARY CALCIUM SCORES: ?Left Main: 0 ?LAD: 0 ?LCx: 0 ?RCA: 0 ?Total Agatston Score: 0 ?MESA database percentile: 0 ? ?AORTA MEASUREMENTS: ?Ascending Aorta: 34 mm ?Descending Aorta: 23 mm ?  ?OTHER FINDINGS: ?The heart size is within normal limits. No pericardial fluid  is ?identified. Visualized segments of the thoracic aorta and central ?pulmonary arteries are normal in caliber. Visualized mediastinum and ?hilar regions demonstrate no lymphadenopathy or masses. Visualized ?lungs show no evidence of pulmonary edema, consolidation, ?pneumothorax, nodule or pleural fluid. Visualized upper abdomen and ?bony structures are unremarkable. ? ?PCV MYOCARDIAL PERFUSION WITH LEXISCAN 12/17/2020 ?Equivocal ECG stress. The patient was injected with 0.4 mg of Intravenous Lexiscan over 15 sec. Additionally, patient exercised on a Modified Bruce  protocol achieving 69% of MPHR. Resting EKG demonstrated normal sinus rhythm. Peak EKG revealed greater than 1 mm ST depression present. With Lexiscan infusion, patient experienced chest pressure 3/10. ?Myocardial perfusion is abnormal. There is a fixed mild defect in the inferior and apical regions consistent with breast attenuation.  No ischemia or scar. ?Overall LV systolic function is abnormal without regional wall motion abnormalities. Stress LV EF: 46%, however, visually LVEF appears normal. ?Intermediate risk study in view of abnormal EKG response, chest pain and abnormal calculated LVEF. ?No previous exam available for comparison. ? ?PCV ECHOCARDIOGRAM COMPLETE 12/12/2020 ?Left ventricle cavity is normal in size and wall thickness. Normal global wall motion. Normal LV systolic function with EF 60%. Normal diastolic filling pattern. Mild (Grade I) mitral regurgitation. ?Mild tricuspid regurgitation. ?No evidence of pulmonary hypertension. ? ?EKG:  ?03/28/2022: Sinus rhythm at a rate of 69 bpm.  Normal axis.  Poor R wave progression, cannot exclude anteroseptal infarct old.  Low voltage complexes.  Nonspecific T wave abnormality. ? ?11/05/2021: Sinus rhythm at a rate of 71 bpm.  Normal axis.  No evidence of ischemia or underlying injury pattern.  Compared to EKG 03/28/2021, no significant change. ? ?Assessment  ? ?  ICD-10-CM   ?1. Precordial pain   R07.2   ?  ?2. Essential hypertension  I10 EKG 12-Lead  ?  ?  ?Medications Discontinued During This Encounter  ?Medication Reason  ? celecoxib (CELEBREX) 200 MG capsule   ? ?  ?No orders of the defined t

## 2022-03-28 ENCOUNTER — Encounter: Payer: Self-pay | Admitting: Student

## 2022-03-28 ENCOUNTER — Ambulatory Visit: Payer: BC Managed Care – PPO | Admitting: Student

## 2022-03-28 VITALS — BP 133/98 | HR 65 | Temp 98.0°F | Resp 16 | Ht 68.0 in | Wt 212.0 lb

## 2022-03-28 DIAGNOSIS — I10 Essential (primary) hypertension: Secondary | ICD-10-CM

## 2022-03-28 DIAGNOSIS — R072 Precordial pain: Secondary | ICD-10-CM

## 2022-04-27 ENCOUNTER — Other Ambulatory Visit: Payer: Self-pay | Admitting: Student

## 2022-04-27 DIAGNOSIS — R9439 Abnormal result of other cardiovascular function study: Secondary | ICD-10-CM

## 2022-07-10 ENCOUNTER — Other Ambulatory Visit: Payer: Self-pay | Admitting: Student

## 2022-07-10 DIAGNOSIS — R9439 Abnormal result of other cardiovascular function study: Secondary | ICD-10-CM

## 2022-08-05 ENCOUNTER — Other Ambulatory Visit: Payer: Self-pay

## 2022-08-05 DIAGNOSIS — R9439 Abnormal result of other cardiovascular function study: Secondary | ICD-10-CM

## 2022-08-05 MED ORDER — METOPROLOL SUCCINATE ER 50 MG PO TB24: 50.0000 mg | ORAL_TABLET | Freq: Every day | ORAL | 3 refills | Status: DC

## 2023-02-24 ENCOUNTER — Telehealth: Payer: Self-pay

## 2023-02-24 NOTE — Telephone Encounter (Signed)
I would like to see her. Routine

## 2023-02-24 NOTE — Telephone Encounter (Signed)
Patient has been exercising for a couple of months. She waking up at night with tightness in middle of chest, happens only at night. Pain only lasts about a minute or so.  Also, having pain in heel and hand on left side. Should she be concerned.

## 2023-02-25 NOTE — Telephone Encounter (Signed)
Please contact patient to schedule appointment.

## 2023-03-05 ENCOUNTER — Ambulatory Visit: Payer: BC Managed Care – PPO | Admitting: Cardiology

## 2023-03-09 ENCOUNTER — Encounter: Payer: Self-pay | Admitting: Cardiology

## 2023-03-09 ENCOUNTER — Ambulatory Visit: Payer: BC Managed Care – PPO | Admitting: Cardiology

## 2023-03-09 VITALS — BP 135/85 | HR 87 | Resp 16 | Ht 68.0 in | Wt 203.0 lb

## 2023-03-09 DIAGNOSIS — R002 Palpitations: Secondary | ICD-10-CM

## 2023-03-09 NOTE — Patient Instructions (Signed)
PAC = Premature atrial complexes: These arise from the upper chamber of the heart.  These are very common and are not dangerous.  Extra skipped beat coming from the upper chamber (atrium) and mostly are life altering (nuisance) than life threatening and mostly treated by reassurance.  There may not be any specific reasons for this, however patients with excessive caffeine, anxiety, lack of sleep, alcohol or thyroid problems can have these episodes.   Premature ventricular complexes (PVC) means extra heartbeat from the lower chamber of the heart.  These are very common and are not dangerous.  Extra skipped beat coming from the bottom chamber (ventricle) and mostly are life altering (nuisance) than life threatening and mostly treated by reassurance.  There may not be any specific reasons for this, however patients with excessive caffeine, anxiety, lack of sleep, alcohol or thyroid problems can have these episodes.  Rarely patients with block coronary arteries or family history of heart muscle disease may be the reason.  If more questions, he can discuss with her doctor on office visit.  

## 2023-03-09 NOTE — Progress Notes (Unsigned)
Primary Physician/Referring:  Orpah Melter, MD  Patient ID: Paula Ortega, female    DOB: 10-03-1971, 52 y.o.   MRN: [id number]  Chief Complaint  Patient presents with   Chest Pain   Follow-up   HPI:    Paula Ortega  is a 52 y.o. caucasian female with history of thyroid cancer and thyroidectomy in 2005. Denies history of hypertension, hyperlipidemia, coronary artery disease, diabetes, TIA/CVA. Patient was adopted and only knows her mother's medical history, denies known family history of premature CAD or heart failure. She has never smoked and occasionally drinks alcohol.   Patient presents for follow-up.  At last office visit given chest pain ordered coronary calcium score, which revealed a total coronary calcium score of 0.  Patient is feeling quite well at today's office visit without specific complaints.  She has had no recurrence of chest pain.  She continues to increase physical activity.  Denies dyspnea, syncope, near syncope, palpitations, dizziness, orthopnea, PND.  Past Medical History:  Diagnosis Date   Hypertension    Thyroid disease    Past Surgical History:  Procedure Laterality Date   ABLATION     uterine   CESAREAN SECTION     KNEE SURGERY Left 01/2021   MENISCECTOMY     THYROIDECTOMY     Family History  Adopted: Yes    Social History   Tobacco Use   Smoking status: Never   Smokeless tobacco: Never  Substance Use Topics   Alcohol use: Yes    Comment: occasionally   Marital Status: Married   ROS  Review of Systems  Cardiovascular:  Negative for chest pain, dyspnea on exertion and leg swelling.    Objective  Blood pressure 135/85, pulse 87, resp. rate 16, height 5\' 8"  (1.727 m), weight 203 lb (92.1 kg), SpO2 99 %.     03/09/2023    2:51 PM 03/28/2022    8:29 AM 03/28/2022    8:24 AM  Vitals with BMI  Height 5\' 8"   5\' 8"   Weight 203 lbs  212 lbs  BMI [id number]  0000000  Systolic A999333 Q000111Q [id number]  Diastolic 85 98 89  Pulse 87 65 68   Body mass index  is 30.87 kg/m.    Physical Exam Vitals reviewed.  Constitutional:      General: She is not in acute distress.    Appearance: Normal appearance. She is obese. She is not ill-appearing.  Cardiovascular:     Rate and Rhythm: Normal rate and regular rhythm.     Pulses: Intact distal pulses.     Heart sounds: S1 normal and S2 normal. No murmur heard.    No gallop.  Pulmonary:     Effort: Pulmonary effort is normal. No respiratory distress.     Breath sounds: No wheezing, rhonchi or rales.  Musculoskeletal:     Right lower leg: No edema.     Left lower leg: No edema.  Skin:    General: Skin is warm and dry.  Neurological:     Mental Status: She is alert.    Laboratory examination:   External labs:   Labs 11/05/2022:  TSH normal at 0.78.  Hemoglobin 12.700 g/d 01/09/2021 Platelets 265.000 Th 2/2/202  Creatinine, Serum 0.720 mg/ 01/09/2021 Potassium 4.000 mm 01/09/2021 Magnesium N/D ALT (SGPT) 18.000 U/L 01/09/2021    Allergies  No Known Allergies   Medications Prior to Visit:   Outpatient Medications Prior to Visit  Medication Sig Dispense Refill   aspirin EC 81  MG tablet Take 1 tablet (81 mg total) by mouth daily. Swallow whole. 30 tablet 3   atorvastatin (LIPITOR) 20 MG tablet TAKE 1 TABLET BY MOUTH EVERY DAY 90 tablet 3   Calcium Carbonate (CALCIUM 500 PO) Take 1 tablet by mouth daily at 6 (six) AM.     calcium gluconate 500 MG tablet Take 1 tablet by mouth 3 (three) times daily.     COLLAGEN-VITAMIN C PO Take 2.5 g by mouth.     [facility] ACETAMINOPHEN EX ST 500 MG tablet as needed.     cyanocobalamin (VITAMIN B12) 1000 MCG tablet Take 1,000 mcg by mouth daily.     levothyroxine (SYNTHROID, LEVOTHROID) 150 MCG tablet Take 125 mcg by mouth daily before breakfast. Takes 6 days/wk; on 7th day only takes 1 pills     metoprolol succinate (TOPROL-XL) 50 MG 24 hr tablet Take 1 tablet (50 mg total) by mouth daily. TAKE WITH OR IMMEDIATELY FOLLOWING A MEAL. 90 tablet 3   Multiple  Vitamin (MULTIVITAMIN) tablet Take 1 tablet by mouth daily.     No facility-administered medications prior to visit.   Final Medications at End of Visit    Current Meds  Medication Sig   aspirin EC 81 MG tablet Take 1 tablet (81 mg total) by mouth daily. Swallow whole.   atorvastatin (LIPITOR) 20 MG tablet TAKE 1 TABLET BY MOUTH EVERY DAY   Calcium Carbonate (CALCIUM 500 PO) Take 1 tablet by mouth daily at 6 (six) AM.   calcium gluconate 500 MG tablet Take 1 tablet by mouth 3 (three) times daily.   COLLAGEN-VITAMIN C PO Take 2.5 g by mouth.   [facility]S ACETAMINOPHEN EX ST 500 MG tablet as needed.   cyanocobalamin (VITAMIN B12) 1000 MCG tablet Take 1,000 mcg by mouth daily.   levothyroxine (SYNTHROID, LEVOTHROID) 150 MCG tablet Take 125 mcg by mouth daily before breakfast. Takes 6 days/wk; on 7th day only takes 1 pills   metoprolol succinate (TOPROL-XL) 50 MG 24 hr tablet Take 1 tablet (50 mg total) by mouth daily. TAKE WITH OR IMMEDIATELY FOLLOWING A MEAL.   Multiple Vitamin (MULTIVITAMIN) tablet Take 1 tablet by mouth daily.   Radiology:   No results found.  Cardiac Studies:  CORONARY CALCIUM SCORES: Left Main: 0 LAD: 0 LCx: 0 RCA: 0 Total Agatston Score: 0 MESA database percentile: 0  AORTA MEASUREMENTS: Ascending Aorta: 34 mm Descending Aorta: 23 mm   OTHER FINDINGS: The heart size is within normal limits. No pericardial fluid is identified. Visualized segments of the thoracic aorta and central pulmonary arteries are normal in caliber. Visualized mediastinum and hilar regions demonstrate no lymphadenopathy or masses. Visualized lungs show no evidence of pulmonary edema, consolidation, pneumothorax, nodule or pleural fluid. Visualized upper abdomen and bony structures are unremarkable.  PCV MYOCARDIAL PERFUSION WITH LEXISCAN 12/17/2020 Equivocal ECG stress. The patient was injected with 0.4 mg of Intravenous Lexiscan over 15 sec. Additionally, patient exercised on a Modified  Bruce protocol achieving 69% of MPHR. Resting EKG demonstrated normal sinus rhythm. Peak EKG revealed greater than 1 mm ST depression present. With Lexiscan infusion, patient experienced chest pressure 3/10. Myocardial perfusion is abnormal. There is a fixed mild defect in the inferior and apical regions consistent with breast attenuation.  No ischemia or scar. Overall LV systolic function is abnormal without regional wall motion abnormalities. Stress LV EF: 46%, however, visually LVEF appears normal. Intermediate risk study in view of abnormal EKG response, chest pain and abnormal calculated LVEF. No previous exam available  for comparison.  PCV ECHOCARDIOGRAM COMPLETE 12/12/2020 Left ventricle cavity is normal in size and wall thickness. Normal global wall motion. Normal LV systolic function with EF 60%. Normal diastolic filling pattern. Mild (Grade I) mitral regurgitation. Mild tricuspid regurgitation. No evidence of pulmonary hypertension.  EKG:   EKG 03/09/2023: Normal sinus rhythm at the rate of 75 bpm, normal EKG.  Assessment     ICD-10-CM   1. Palpitations  R00.2 EKG 12-Lead      There are no discontinued medications.    No orders of the defined types were placed in this encounter.  Recommendations:   Paula Ortega is a 52 y.o. caucasian female with history of thyroid cancer and thyroidectomy in 2005. Denies history of hypertension, hyperlipidemia, coronary artery disease, diabetes, TIA/CVA. Patient was adopted and only knows her mother's medical history, denies known family history of premature CAD or heart failure. She has never smoked and occasionally drinks alcohol.   Patient presents for follow-up.  At last office visit given chest pain ordered coronary calcium score, which revealed a total coronary calcium score of 0.  Patient is feeling well overall, asymptomatic from a cardiovascular standpoint.  She has had no recurrence of chest pain.  Reviewed and discussed results of  coronary calcium score, details above.  Patient is stable from a cardiovascular standpoint.  Recommend continuing present medications.  Follow-up as needed.   Adrian Prows, PA-C 03/09/2023, 3:48 PM Office: [phone]

## 2023-04-17 ENCOUNTER — Encounter: Payer: Self-pay | Admitting: Cardiology

## 2023-05-13 ENCOUNTER — Other Ambulatory Visit (HOSPITAL_COMMUNITY): Payer: Self-pay | Admitting: Family Medicine

## 2023-05-13 DIAGNOSIS — M79672 Pain in left foot: Secondary | ICD-10-CM

## 2023-05-14 ENCOUNTER — Ambulatory Visit (HOSPITAL_COMMUNITY)
Admission: RE | Admit: 2023-05-14 | Discharge: 2023-05-14 | Disposition: A | Discharge status: Home / Self Care | Payer: BC Managed Care – PPO | Source: Ambulatory Visit | Attending: Family Medicine | Admitting: Family Medicine

## 2023-05-14 DIAGNOSIS — M79672 Pain in left foot: Secondary | ICD-10-CM | POA: Insufficient documentation

## 2023-07-28 ENCOUNTER — Other Ambulatory Visit: Payer: Self-pay | Admitting: Cardiology

## 2023-07-28 DIAGNOSIS — R9439 Abnormal result of other cardiovascular function study: Secondary | ICD-10-CM

## 2023-12-21 IMAGING — CT CT CARDIAC CORONARY ARTERY CALCIUM SCORE
3 series · 14 of 20 positions shown, 16 images · non-contrast
Comparison: None.

CLINICAL DATA: 50-year-old Caucasian female with history of
hypertension.

EXAM:
CT CARDIAC CORONARY ARTERY CALCIUM SCORE
TECHNIQUE: Non-contrast imaging through the heart was performed using
prospective ECG gating. Image post processing was performed on an
independent workstation, allowing for quantitative analysis of the
heart and coronary arteries. Note that this exam targets the heart
and the chest was not imaged in its entirety.

[Series 2: calcium scoring 2.00 qr36 bestdiast 68% hrt calciu · axial · 0.39mm/px · z∈[+1611,+1695]mm · 4 of 70 slices shown]
[im 14/70  vessel]
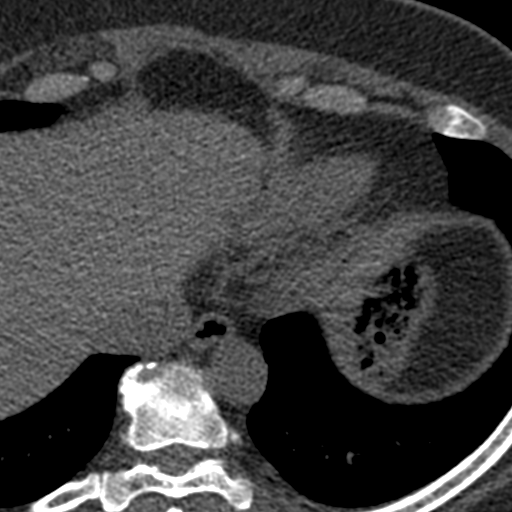
[im 28/70  vessel]
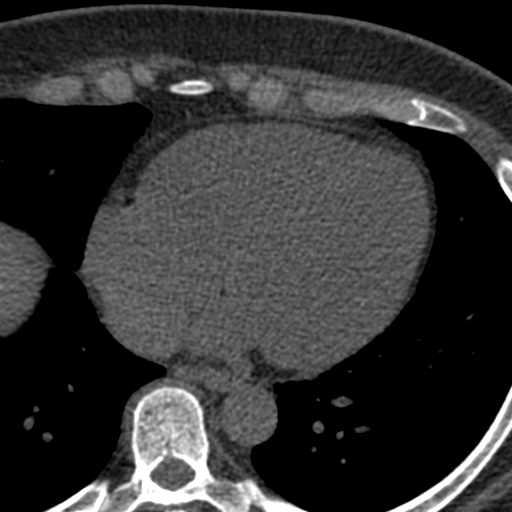
[im 42/70  vessel]
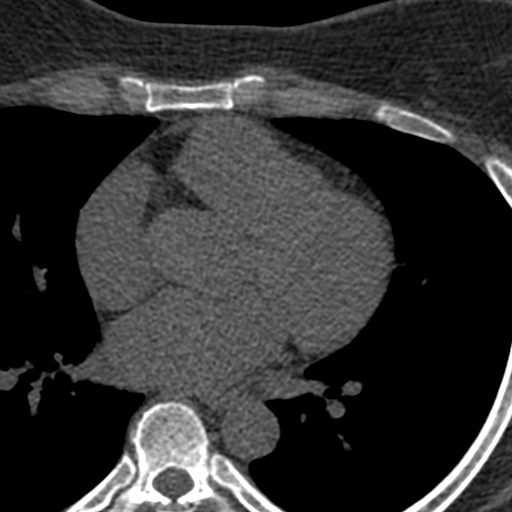
[im 56/70  vessel]
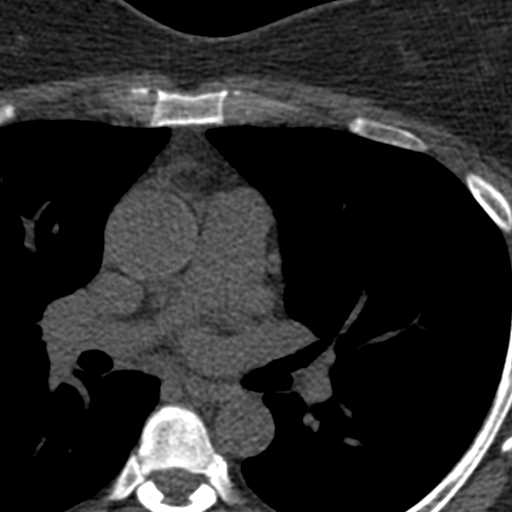

[Series 3: calcium scoring 2.00 br40 bestdiast 68% axial · axial · 0.53mm/px · z∈[+1607,+1699]mm · 5 of 70 slices shown, 7 images]
[im 12/70  vessel]
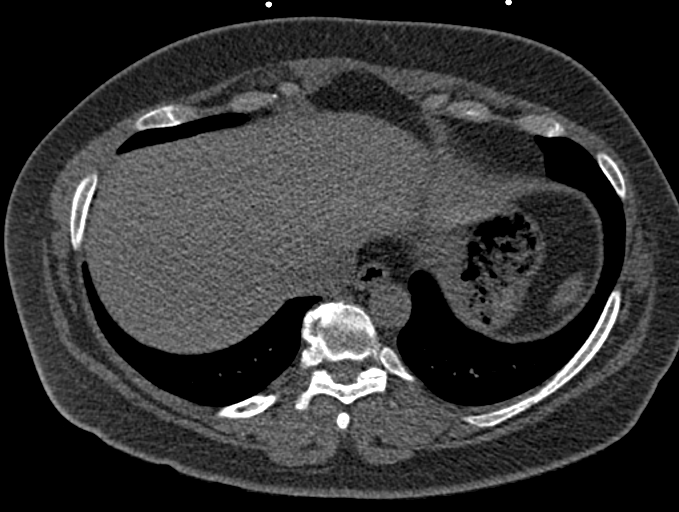
[im 12/70  lung]
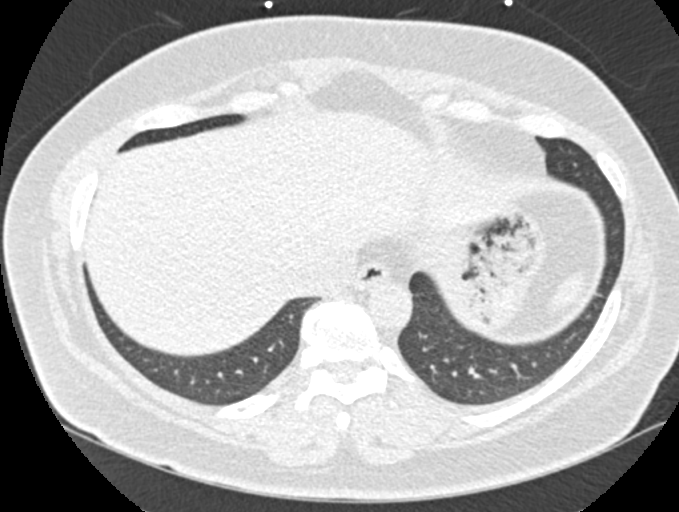
[im 24/70  vessel]
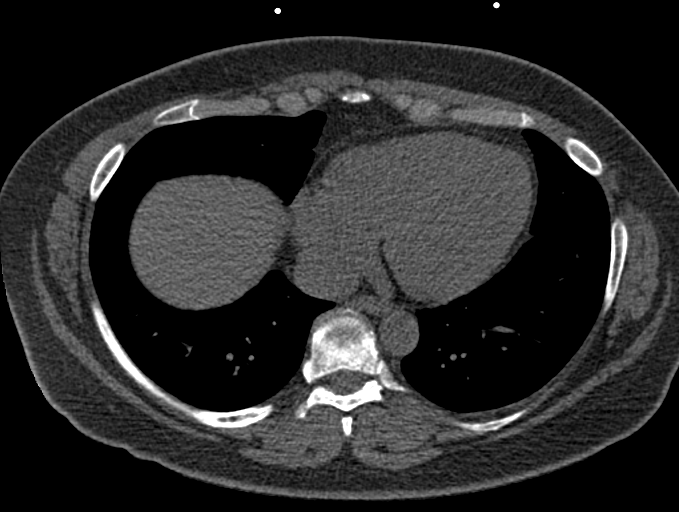
[im 35/70  vessel]
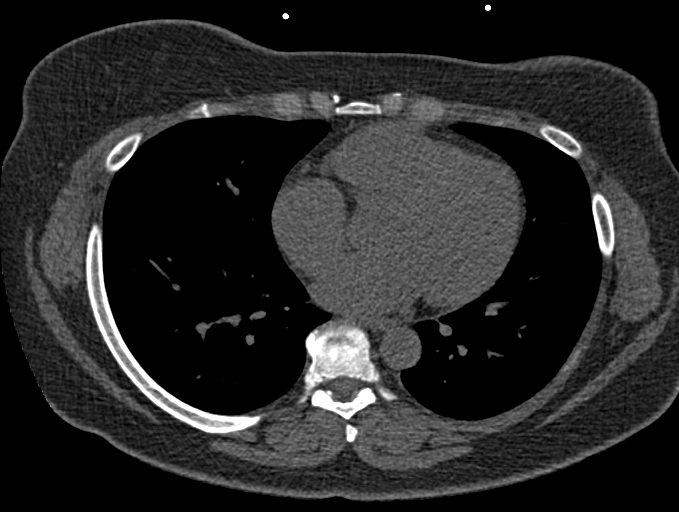
[im 47/70  vessel]
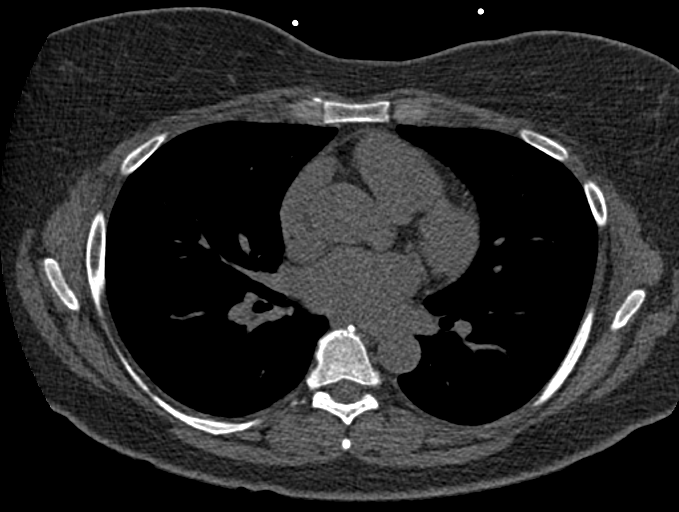
[im 58/70  vessel]
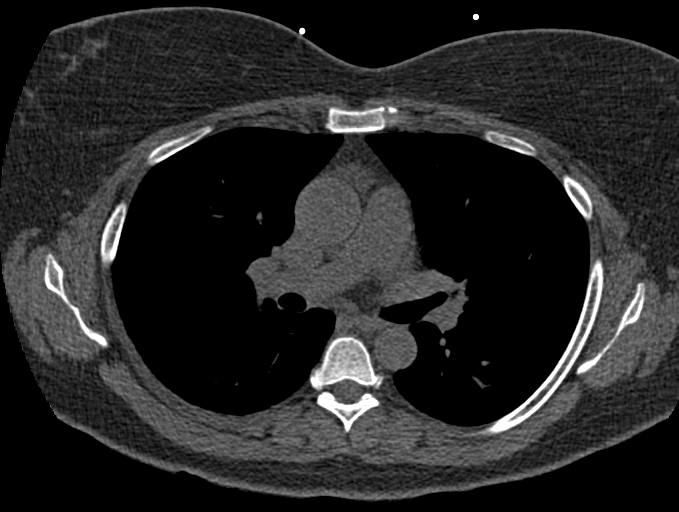
[im 58/70  lung]
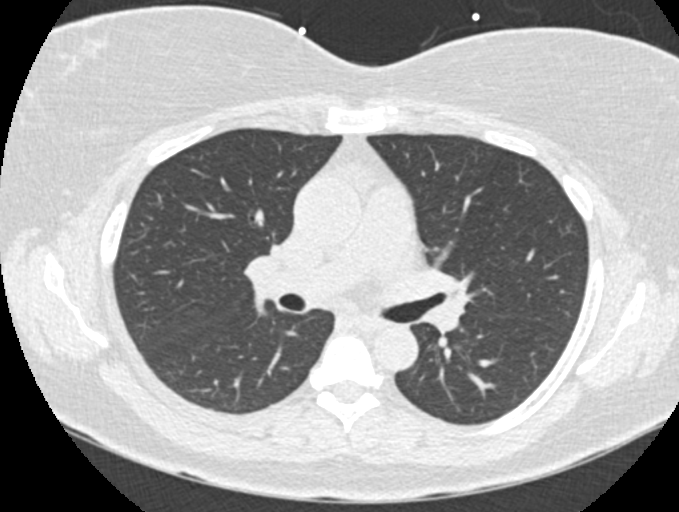

[Series 9: calcium scoring 2.00 br60 bestdiast 68% lungs · axial · 0.56mm/px · z∈[+1607,+1699]mm · 5 of 70 slices shown]
[im 12/70  vessel]
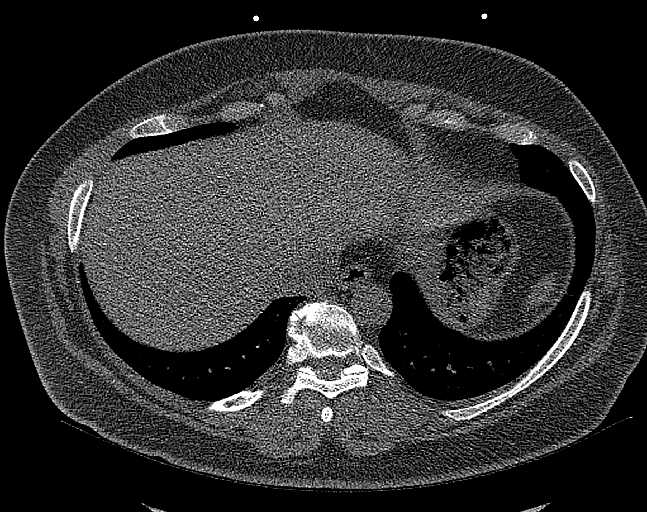
[im 24/70  vessel]
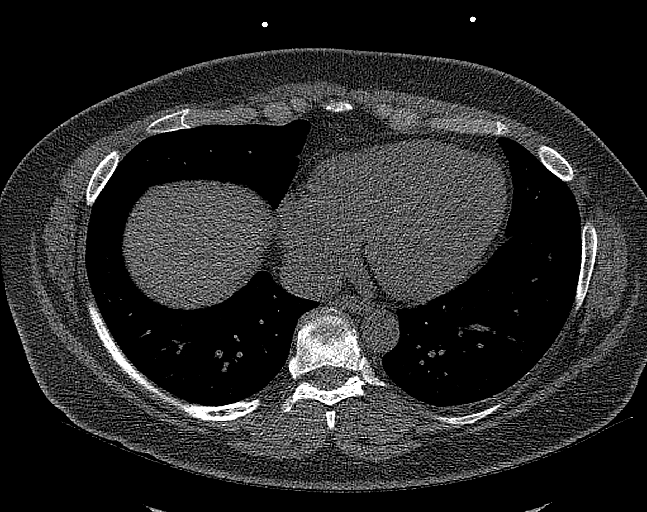
[im 35/70  vessel]
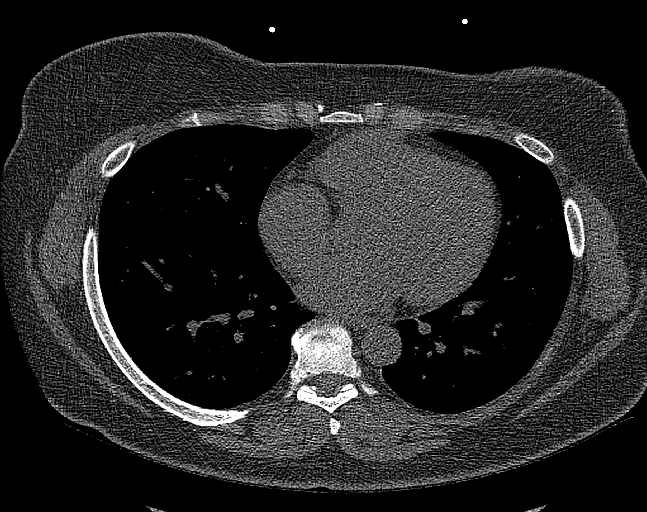
[im 47/70  vessel]
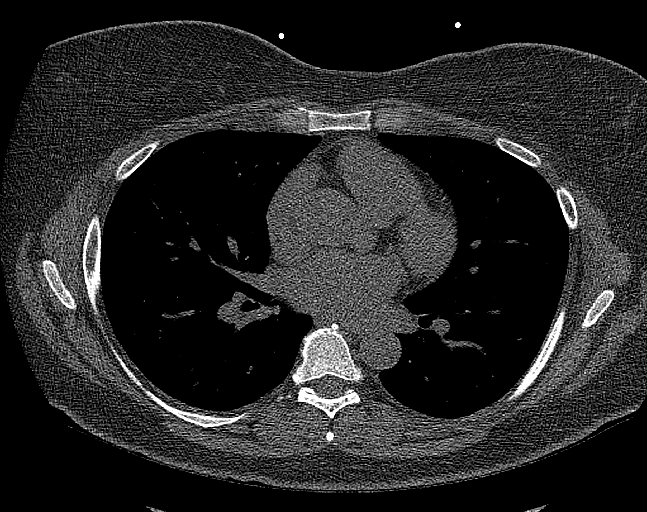
[im 58/70  vessel]
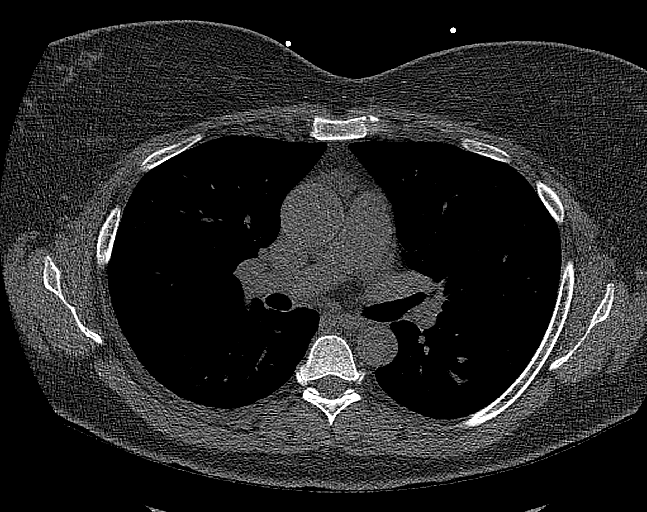

[14 of 20 positions shown; findings below may reference images not displayed]

FINDINGS: CORONARY CALCIUM SCORES:

Left Main: 0

LAD: 0

LCx: 0

RCA: 0

Total Agatston Score: 0

[HOSPITAL] percentile: 0

AORTA MEASUREMENTS:

Ascending Aorta: 34 mm

Descending Aorta: 23 mm

OTHER FINDINGS:


## 2024-03-28 ENCOUNTER — Encounter (HOSPITAL_COMMUNITY): Payer: Self-pay

## 2024-03-28 ENCOUNTER — Ambulatory Visit (HOSPITAL_COMMUNITY)
Admission: EM | Admit: 2024-03-28 | Discharge: 2024-03-28 | Disposition: A | Discharge status: Home / Self Care | Attending: Family Medicine | Admitting: Family Medicine

## 2024-03-28 DIAGNOSIS — T161XXA Foreign body in right ear, initial encounter: Secondary | ICD-10-CM

## 2024-03-28 MED ORDER — LIDOCAINE-EPINEPHRINE-TETRACAINE (LET) TOPICAL GEL
TOPICAL | Status: AC
  Filled 2024-03-28: qty 3

## 2024-03-28 MED ORDER — NEOMYCIN-POLYMYXIN-HC 3.5-10000-1 OT SUSP: 4.0000 [drp] | Freq: Three times a day (TID) | OTIC | 0 refills | Status: AC

## 2024-03-28 NOTE — ED Triage Notes (Signed)
 Patient here today with c/o an insect the flew in her right ear while she was mountain biking today. Patient's ear is bleeding.

## 2024-03-30 NOTE — ED Provider Notes (Signed)
  [facility]   [id number] 03/28/24 Arrival Time: 1341  ASSESSMENT & PLAN:  1. Acute foreign body of right ear canal, initial encounter    Wasp-like insect removed from R ear canal after LET get laced in ear for a few minutes. No complications. Ear canal has quite a bit of bleeding but the portion of TM I can visualize appears intact. No active bleeding noted. Normal hearing.  BeginL: Meds ordered this encounter  Medications   neomycin -polymyxin-hydrocortisone (CORTISPORIN) 3.5-10000-1 OTIC suspension    Sig: Place 4 drops into the right ear 3 (three) times daily.    Dispense:  10 mL    Refill:  0     Follow-up Information     [facility].   Specialty: Urgent Care Why: In a few days so we can look at your ear again. Contact information: [address] [place]  [phone]                Reviewed expectations re: course of current medical issues. Questions answered. Outlined signs and symptoms indicating need for more acute intervention. Understanding verbalized. After Visit Summary given.   SUBJECTIVE: History from: Patient. Paula Ortega is a 53 y.o. female. Patient here today with c/o an insect the flew in her right ear while she was mountain biking today. Patient's ear is bleeding.  Painful at times. Denies hearing changes.  OBJECTIVE:  Vitals:   03/28/24 1400  BP: (!) 149/98  Pulse: 93  Resp: 16  Temp: 98.4 F (36.9 C)  TempSrc: Oral  SpO2: 98%    General appearance: alert; no distress Eyes: PERRLA; EOMI; conjunctivae normal HENT: [place]; AT; black object in R EAC Neck: supple  Psychological: alert and cooperative; normal mood and affect   No Known Allergies  Past Medical History:  Diagnosis Date   Hypertension    Thyroid  disease    Social History   Socioeconomic History   Marital status: Married    Spouse name: Not on file   Number of children: 2   Years of education:  Not on file   Highest education level: Not on file  Occupational History   Not on file  Tobacco Use   Smoking status: Never   Smokeless tobacco: Never  Vaping Use   Vaping status: Never Used  Substance and Sexual Activity   Alcohol use: Yes    Comment: occasionally   Drug use: No   Sexual activity: Not on file  Other Topics Concern   Not on file  Social History Narrative   Not on file   Social Drivers of Health   Financial Resource Strain: Not on file  Food Insecurity: Not on file  Transportation Needs: Not on file  Physical Activity: Not on file  Stress: Not on file  Social Connections: Not on file  Intimate Partner Violence: Not on file   Family History  Adopted: Yes   Past Surgical History:  Procedure Laterality Date   ABLATION     uterine   CESAREAN SECTION     KNEE SURGERY Left 01/2021   MENISCECTOMY     THYROIDECTOMY       Afton Albright, MD 03/30/24 1010

## 2024-03-31 ENCOUNTER — Ambulatory Visit (HOSPITAL_COMMUNITY)
Admission: EM | Admit: 2024-03-31 | Discharge: 2024-03-31 | Disposition: A | Discharge status: Home / Self Care | Attending: Internal Medicine | Admitting: Internal Medicine

## 2024-03-31 ENCOUNTER — Encounter (HOSPITAL_COMMUNITY): Payer: Self-pay

## 2024-03-31 DIAGNOSIS — H9201 Otalgia, right ear: Secondary | ICD-10-CM | POA: Diagnosis not present

## 2024-03-31 DIAGNOSIS — S00411D Abrasion of right ear, subsequent encounter: Secondary | ICD-10-CM | POA: Diagnosis not present

## 2024-03-31 DIAGNOSIS — H60551 Acute reactive otitis externa, right ear: Secondary | ICD-10-CM

## 2024-03-31 MED ORDER — AMOXICILLIN-POT CLAVULANATE 875-125 MG PO TABS: 1.0000 | ORAL_TABLET | Freq: Two times a day (BID) | ORAL | 0 refills | Status: AC

## 2024-03-31 MED ORDER — PREDNISONE 20 MG PO TABS: 40.0000 mg | ORAL_TABLET | Freq: Every day | ORAL | 0 refills | Status: AC

## 2024-03-31 NOTE — ED Provider Notes (Signed)
 [facility]    CSN: [id number] Arrival date & time: 03/31/24  1610      History   Chief Complaint Chief Complaint  Patient presents with   Otalgia    Follow up    HPI Paula Ortega is a 53 y.o. female.   53 year old female who presents to urgent care for follow-up after having an insect removed from her right ear on 4/21.  It apparently was a fully intact bug that appeared to be a wasp per the provider.  She did have some swelling and bleeding from the ear canal afterwards.  She was sent home on neomycin  with hydrocortisone eardrops.  She has been doing these as directed.  She reports that she is still having muffled hearing and that whenever she uses the eardrops she cannot hear during the time that the drops are in.  She denies any fevers or chills.  The area continues to have some crusting but no significant bleeding.  She denies any fevers or chills.   Otalgia Associated symptoms: hearing loss   Associated symptoms: no abdominal pain, no cough, no fever, no rash, no sore throat and no vomiting     Past Medical History:  Diagnosis Date   Hypertension    Thyroid  disease     There are no active problems to display for this patient.   Past Surgical History:  Procedure Laterality Date   ABLATION     uterine   CESAREAN SECTION     KNEE SURGERY Left 01/2021   MENISCECTOMY     THYROIDECTOMY      OB History   No obstetric history on file.      Home Medications    Prior to Admission medications   Medication Sig Start Date End Date Taking? Authorizing Provider  aspirin  EC 81 MG tablet Take 1 tablet (81 mg total) by mouth daily. Swallow whole. 12/28/20   Cantwell, Celeste C, PA-C  atorvastatin  (LIPITOR) 20 MG tablet TAKE 1 TABLET BY MOUTH EVERY DAY 04/28/22   Cantwell, Celeste C, PA-C  Calcium  Carbonate (CALCIUM  500 PO) Take 1 tablet by mouth daily at 6 (six) AM.    [provider]  calcium  gluconate 500 MG tablet Take 1 tablet by mouth 3 (three)  times daily.    [provider]  COLLAGEN-VITAMIN C PO Take 2.5 g by mouth.    [provider]  [facility] ACETAMINOPHEN EX ST 500 MG tablet as needed. 01/31/21   [provider]  cyanocobalamin (VITAMIN B12) 1000 MCG tablet Take 1,000 mcg by mouth daily.    [provider]  levothyroxine (SYNTHROID, LEVOTHROID) 150 MCG tablet Take 125 mcg by mouth daily before breakfast. Takes 6 days/wk; on 7th day only takes 1 pills    [provider]  meloxicam (MOBIC) 15 MG tablet Take 15 mg by mouth daily. 12/17/23   [provider]  metoprolol  succinate (TOPROL -XL) 50 MG 24 hr tablet TAKE 1 TABLET BY MOUTH DAILY. TAKE WITH OR IMMEDIATELY FOLLOWING A MEAL. 07/28/23   Knox Perl, MD  Multiple Vitamin (MULTIVITAMIN) tablet Take 1 tablet by mouth daily.    [provider]  neomycin -polymyxin-hydrocortisone (CORTISPORIN) 3.5-10000-1 OTIC suspension Place 4 drops into the right ear 3 (three) times daily. 03/28/24   Afton Albright, MD    Family History Family History  Adopted: Yes    Social History Social History   Tobacco Use   Smoking status: Never   Smokeless tobacco: Never  Vaping Use  Vaping status: Never Used  Substance Use Topics   Alcohol use: Yes    Comment: occasionally   Drug use: No     Allergies   Patient has no known allergies.   Review of Systems Review of Systems  Constitutional:  Negative for chills and fever.  HENT:  Positive for ear pain and hearing loss. Negative for sore throat.   Eyes:  Negative for pain and visual disturbance.  Respiratory:  Negative for cough and shortness of breath.   Cardiovascular:  Negative for chest pain and palpitations.  Gastrointestinal:  Negative for abdominal pain and vomiting.  Genitourinary:  Negative for dysuria and hematuria.  Musculoskeletal:  Negative for arthralgias and back pain.  Skin:  Negative for color change and rash.  Neurological:  Negative for seizures and syncope.  All  other systems reviewed and are negative.    Physical Exam Triage Vital Signs ED Triage Vitals  Encounter Vitals Group     BP 03/31/24 1726 (!) 150/97     Systolic BP Percentile --      Diastolic BP Percentile --      Pulse Rate 03/31/24 1726 73     Resp 03/31/24 1726 16     Temp 03/31/24 1726 98.6 F (37 C)     Temp Source 03/31/24 1726 Oral     SpO2 03/31/24 1726 97 %     Weight --      Height --      Head Circumference --      Peak Flow --      Pain Score 03/31/24 1725 1     Pain Loc --      Pain Education --      Exclude from Growth Chart --    No data found.  Updated Vital Signs BP (!) 150/97 (BP Location: Right Arm)   Pulse 73   Temp 98.6 F (37 C) (Oral)   Resp 16   LMP  (LMP Unknown)   SpO2 97%   Visual Acuity Right Eye Distance:   Left Eye Distance:   Bilateral Distance:    Right Eye Near:   Left Eye Near:    Bilateral Near:     Physical Exam Vitals and nursing note reviewed.  Constitutional:      General: She is not in acute distress.    Appearance: She is well-developed.  HENT:     Head: Normocephalic and atraumatic.     Right Ear: Decreased hearing noted. Swelling present. There is no impacted cerumen. No foreign body. Tympanic membrane is not injected.     Left Ear: Tympanic membrane normal.     Ears:     Comments: The ear canal is very edematous and inflamed but there does not appear to be any purulence.  Only a small area of the tympanic membrane is visible at the base but this appears to be normal in color and not inflamed or erythematous.  There is no external swelling. Eyes:     Conjunctiva/sclera: Conjunctivae normal.  Cardiovascular:     Rate and Rhythm: Normal rate and regular rhythm.     Heart sounds: No murmur heard. Pulmonary:     Effort: Pulmonary effort is normal. No respiratory distress.     Breath sounds: Normal breath sounds.  Abdominal:     Palpations: Abdomen is soft.     Tenderness: There is no abdominal tenderness.   Musculoskeletal:        General: No swelling.     Cervical  back: Neck supple.  Skin:    General: Skin is warm and dry.     Capillary Refill: Capillary refill takes less than 2 seconds.  Neurological:     Mental Status: She is alert.  Psychiatric:        Mood and Affect: Mood normal.     UC Treatments / Results  Labs (all labs ordered are listed, but only abnormal results are displayed) Labs Reviewed - No data to display  EKG   Radiology No results found.  Procedures Procedures (including critical care time)  Medications Ordered in UC Medications - No data to display  Initial Impression / Assessment and Plan / UC Course  I have reviewed the triage vital signs and the nursing notes.  Pertinent labs & imaging results that were available during my care of the patient were reviewed by me and considered in my medical decision making (see chart for details).     Abrasion of right ear canal, subsequent encounter  Otalgia of right ear  Acute reactive otitis externa of right ear   Right ear canal remains very edematous (swollen) and concern for possible infectious process. Will cover with antibiotics to be safe given the appearance. Also will try steroids by mouth to help with the swelling. We will treat with the following:  Prednisone  40 mg (2 tablets) once daily for 5 days. Take this in the morning.  This is a steroid to help with inflammation and pain.  Augmentin  875 mg twice daily for 7 days.  This is an antibiotic.  Take this with food. May continue to use the previous ear drops as prescribed  Return to urgent care or PCP if symptoms worsen or fail to resolve.    Final Clinical Impressions(s) / UC Diagnoses   Final diagnoses:  None   Discharge Instructions   None    ED Prescriptions   None    PDMP not reviewed this encounter.   Kreg Pesa, [place] 03/31/24 1817

## 2024-03-31 NOTE — ED Triage Notes (Signed)
 Patient here today for a follow up from 03/28/2024 for having a bug in her ear. Patient states that she is much better but she wanted to have someone look in her ear to see if everything is okay. Patient states that it sounds like a tunnel and she has some crust on the outside of her ear. Patient has been using her ear drop 3 times daily.

## 2024-03-31 NOTE — Discharge Instructions (Addendum)
 Right ear canal remains very edematous (swollen) and concern for possible infectious process. Will cover with antibiotics to be safe given the appearance. Also will try steroids by mouth to help with the swelling. We will treat with the following:  Prednisone  40 mg (2 tablets) once daily for 5 days. Take this in the morning.  This is a steroid to help with inflammation and pain.  Augmentin  875 mg twice daily for 7 days.  This is an antibiotic.  Take this with food. May continue to use the previous ear drops as prescribed  Return to urgent care or PCP if symptoms worsen or fail to resolve.

## 2024-08-23 ENCOUNTER — Other Ambulatory Visit: Payer: Self-pay | Admitting: Cardiology

## 2024-08-23 DIAGNOSIS — R9439 Abnormal result of other cardiovascular function study: Secondary | ICD-10-CM

## 2024-09-21 ENCOUNTER — Other Ambulatory Visit: Payer: Self-pay | Admitting: Cardiology

## 2024-09-21 DIAGNOSIS — R9439 Abnormal result of other cardiovascular function study: Secondary | ICD-10-CM
